# Patient Record
Sex: Female | Born: 1955
Health system: Southern US, Community
[De-identification: ages and names within clinical notes are randomized; demographics above are authoritative.]

## PROBLEM LIST (undated history)

## (undated) DIAGNOSIS — R51 Headache: Secondary | ICD-10-CM

## (undated) DIAGNOSIS — D259 Leiomyoma of uterus, unspecified: Secondary | ICD-10-CM

## (undated) DIAGNOSIS — J309 Allergic rhinitis, unspecified: Secondary | ICD-10-CM

## (undated) DIAGNOSIS — I498 Other specified cardiac arrhythmias: Secondary | ICD-10-CM

## (undated) DIAGNOSIS — Z780 Asymptomatic menopausal state: Secondary | ICD-10-CM

## (undated) DIAGNOSIS — E785 Hyperlipidemia, unspecified: Secondary | ICD-10-CM

## (undated) DIAGNOSIS — F329 Major depressive disorder, single episode, unspecified: Secondary | ICD-10-CM

## (undated) DIAGNOSIS — E042 Nontoxic multinodular goiter: Secondary | ICD-10-CM

## (undated) DIAGNOSIS — D72819 Decreased white blood cell count, unspecified: Secondary | ICD-10-CM

## (undated) HISTORY — DX: Hyperlipidemia, unspecified: E78.5

## (undated) HISTORY — DX: Other specified cardiac arrhythmias: I49.8

## (undated) HISTORY — DX: Headache: R51

## (undated) HISTORY — DX: Allergic rhinitis, unspecified: J30.9

## (undated) HISTORY — DX: Nontoxic multinodular goiter: E04.2

## (undated) HISTORY — DX: Decreased white blood cell count, unspecified: D72.819

## (undated) HISTORY — DX: Leiomyoma of uterus, unspecified: D25.9

## (undated) HISTORY — DX: Asymptomatic menopausal state: Z78.0

## (undated) HISTORY — DX: Major depressive disorder, single episode, unspecified: F32.9

---

## 1997-11-19 ENCOUNTER — Ambulatory Visit (HOSPITAL_COMMUNITY): Admission: RE | Admit: 1997-11-19 | Discharge: 1997-11-19 | Payer: Self-pay | Admitting: Obstetrics and Gynecology

## 1999-06-22 HISTORY — PX: ABDOMINAL HYSTERECTOMY: SHX81

## 1999-11-18 ENCOUNTER — Other Ambulatory Visit: Admission: RE | Admit: 1999-11-18 | Discharge: 1999-11-18 | Payer: Self-pay | Admitting: Obstetrics and Gynecology

## 1999-11-20 ENCOUNTER — Encounter: Payer: Self-pay | Admitting: Obstetrics and Gynecology

## 1999-11-20 ENCOUNTER — Ambulatory Visit (HOSPITAL_COMMUNITY): Admission: RE | Admit: 1999-11-20 | Discharge: 1999-11-20 | Payer: Self-pay | Admitting: Obstetrics and Gynecology

## 2000-04-06 ENCOUNTER — Inpatient Hospital Stay (HOSPITAL_COMMUNITY): Admission: RE | Admit: 2000-04-06 | Discharge: 2000-04-08 | Payer: Self-pay | Admitting: Obstetrics and Gynecology

## 2000-04-06 ENCOUNTER — Encounter (INDEPENDENT_AMBULATORY_CARE_PROVIDER_SITE_OTHER): Payer: Self-pay

## 2000-04-27 ENCOUNTER — Ambulatory Visit: Admission: RE | Admit: 2000-04-27 | Discharge: 2000-04-27 | Payer: Self-pay | Admitting: Gynecology

## 2000-07-05 ENCOUNTER — Observation Stay (HOSPITAL_COMMUNITY): Admission: RE | Admit: 2000-07-05 | Discharge: 2000-07-06 | Payer: Self-pay | Admitting: Obstetrics and Gynecology

## 2000-07-05 ENCOUNTER — Encounter (INDEPENDENT_AMBULATORY_CARE_PROVIDER_SITE_OTHER): Payer: Self-pay

## 2000-07-05 ENCOUNTER — Encounter (INDEPENDENT_AMBULATORY_CARE_PROVIDER_SITE_OTHER): Payer: Self-pay | Admitting: Specialist

## 2000-12-05 ENCOUNTER — Other Ambulatory Visit: Admission: RE | Admit: 2000-12-05 | Discharge: 2000-12-05 | Payer: Self-pay | Admitting: Obstetrics and Gynecology

## 2001-12-20 ENCOUNTER — Other Ambulatory Visit: Admission: RE | Admit: 2001-12-20 | Discharge: 2001-12-20 | Payer: Self-pay | Admitting: Obstetrics and Gynecology

## 2002-04-24 ENCOUNTER — Encounter: Payer: Self-pay | Admitting: Obstetrics and Gynecology

## 2002-04-24 ENCOUNTER — Ambulatory Visit (HOSPITAL_COMMUNITY): Admission: RE | Admit: 2002-04-24 | Discharge: 2002-04-24 | Payer: Self-pay | Admitting: Obstetrics and Gynecology

## 2003-02-13 ENCOUNTER — Ambulatory Visit (HOSPITAL_COMMUNITY): Admission: RE | Admit: 2003-02-13 | Discharge: 2003-02-13 | Payer: Self-pay | Admitting: Endocrinology

## 2003-02-13 ENCOUNTER — Encounter: Payer: Self-pay | Admitting: Endocrinology

## 2003-02-26 ENCOUNTER — Other Ambulatory Visit: Admission: RE | Admit: 2003-02-26 | Discharge: 2003-02-26 | Payer: Self-pay | Admitting: Obstetrics and Gynecology

## 2003-03-04 ENCOUNTER — Encounter: Payer: Self-pay | Admitting: Endocrinology

## 2003-03-04 ENCOUNTER — Encounter (INDEPENDENT_AMBULATORY_CARE_PROVIDER_SITE_OTHER): Payer: Self-pay

## 2003-03-04 ENCOUNTER — Ambulatory Visit (HOSPITAL_COMMUNITY): Admission: RE | Admit: 2003-03-04 | Discharge: 2003-03-04 | Payer: Self-pay | Admitting: Endocrinology

## 2004-05-21 ENCOUNTER — Ambulatory Visit: Payer: Self-pay | Admitting: Endocrinology

## 2004-06-03 ENCOUNTER — Ambulatory Visit (HOSPITAL_COMMUNITY): Admission: RE | Admit: 2004-06-03 | Discharge: 2004-06-03 | Payer: Self-pay | Admitting: Endocrinology

## 2004-09-22 ENCOUNTER — Ambulatory Visit (HOSPITAL_COMMUNITY): Admission: RE | Admit: 2004-09-22 | Discharge: 2004-09-22 | Payer: Self-pay | Admitting: Obstetrics and Gynecology

## 2004-10-27 ENCOUNTER — Ambulatory Visit: Payer: Self-pay | Admitting: Endocrinology

## 2004-12-31 ENCOUNTER — Ambulatory Visit: Payer: Self-pay | Admitting: Endocrinology

## 2006-04-07 ENCOUNTER — Ambulatory Visit: Payer: Self-pay | Admitting: Endocrinology

## 2006-04-07 LAB — CONVERTED CEMR LAB
ALT: 16 units/L (ref 0–40)
Albumin: 4.3 g/dL (ref 3.5–5.2)
Basophils Relative: 0.5 % (ref 0.0–1.0)
Bilirubin Urine: NEGATIVE
Calcium: 9.3 mg/dL (ref 8.4–10.5)
Crystals: NEGATIVE
GFR calc non Af Amer: 80 mL/min
Glomerular Filtration Rate, Af Am: 97 mL/min/{1.73_m2}
HDL: 45.8 mg/dL (ref >39.0)
Hemoglobin: 12.7 g/dL (ref 12.0–15.0)
Lymphocytes Relative: 26.8 % (ref 12.0–46.0)
MCV: 89.5 fL (ref 78.0–100.0)
Platelets: 212 10*3/uL (ref 150–400)
Sodium: 140 meq/L (ref 135–145)
TSH: 1.24 microintl units/mL (ref 0.35–5.50)
Total Bilirubin: 0.3 mg/dL (ref 0.3–1.2)
Total Protein: 7 g/dL (ref 6.0–8.3)
Urine Glucose: NEGATIVE mg/dL
Urobilinogen, UA: 0.2 (ref 0.0–1.0)
pH: 7 (ref 5.0–8.0)

## 2006-04-15 ENCOUNTER — Ambulatory Visit: Payer: Self-pay | Admitting: Endocrinology

## 2006-04-21 ENCOUNTER — Ambulatory Visit (HOSPITAL_COMMUNITY): Admission: RE | Admit: 2006-04-21 | Discharge: 2006-04-21 | Payer: Self-pay | Admitting: Endocrinology

## 2006-04-29 ENCOUNTER — Ambulatory Visit: Payer: Self-pay | Admitting: Endocrinology

## 2006-10-24 ENCOUNTER — Ambulatory Visit (HOSPITAL_COMMUNITY): Admission: RE | Admit: 2006-10-24 | Discharge: 2006-10-24 | Payer: Self-pay | Admitting: Obstetrics and Gynecology

## 2006-11-16 ENCOUNTER — Ambulatory Visit: Payer: Self-pay | Admitting: Internal Medicine

## 2007-02-18 ENCOUNTER — Encounter: Payer: Self-pay | Admitting: Endocrinology

## 2007-02-18 DIAGNOSIS — E785 Hyperlipidemia, unspecified: Secondary | ICD-10-CM | POA: Insufficient documentation

## 2007-02-18 DIAGNOSIS — J309 Allergic rhinitis, unspecified: Secondary | ICD-10-CM | POA: Insufficient documentation

## 2007-02-18 HISTORY — DX: Allergic rhinitis, unspecified: J30.9

## 2007-02-18 HISTORY — DX: Hyperlipidemia, unspecified: E78.5

## 2007-05-01 ENCOUNTER — Encounter: Payer: Self-pay | Admitting: Endocrinology

## 2007-07-25 ENCOUNTER — Ambulatory Visit: Payer: Self-pay | Admitting: Endocrinology

## 2008-02-19 ENCOUNTER — Telehealth (INDEPENDENT_AMBULATORY_CARE_PROVIDER_SITE_OTHER): Payer: Self-pay | Admitting: *Deleted

## 2008-11-07 ENCOUNTER — Ambulatory Visit: Payer: Self-pay | Admitting: Endocrinology

## 2008-11-07 DIAGNOSIS — R51 Headache: Secondary | ICD-10-CM

## 2008-11-07 DIAGNOSIS — F3289 Other specified depressive episodes: Secondary | ICD-10-CM

## 2008-11-07 DIAGNOSIS — I498 Other specified cardiac arrhythmias: Secondary | ICD-10-CM

## 2008-11-07 DIAGNOSIS — E042 Nontoxic multinodular goiter: Secondary | ICD-10-CM

## 2008-11-07 DIAGNOSIS — D72819 Decreased white blood cell count, unspecified: Secondary | ICD-10-CM | POA: Insufficient documentation

## 2008-11-07 DIAGNOSIS — F329 Major depressive disorder, single episode, unspecified: Secondary | ICD-10-CM

## 2008-11-07 DIAGNOSIS — R519 Headache, unspecified: Secondary | ICD-10-CM | POA: Insufficient documentation

## 2008-11-07 DIAGNOSIS — D259 Leiomyoma of uterus, unspecified: Secondary | ICD-10-CM | POA: Insufficient documentation

## 2008-11-07 DIAGNOSIS — Z780 Asymptomatic menopausal state: Secondary | ICD-10-CM

## 2008-11-07 HISTORY — DX: Leiomyoma of uterus, unspecified: D25.9

## 2008-11-07 HISTORY — DX: Headache: R51

## 2008-11-07 HISTORY — DX: Major depressive disorder, single episode, unspecified: F32.9

## 2008-11-07 HISTORY — DX: Other specified cardiac arrhythmias: I49.8

## 2008-11-07 HISTORY — DX: Asymptomatic menopausal state: Z78.0

## 2008-11-07 HISTORY — DX: Other specified depressive episodes: F32.89

## 2008-11-07 HISTORY — DX: Decreased white blood cell count, unspecified: D72.819

## 2008-11-07 HISTORY — DX: Nontoxic multinodular goiter: E04.2

## 2008-11-07 LAB — CONVERTED CEMR LAB
ALT: 12 units/L (ref 0–35)
Alkaline Phosphatase: 65 units/L (ref 39–117)
Basophils Absolute: 0 10*3/uL (ref 0.0–0.1)
Chloride: 104 meq/L (ref 96–112)
Eosinophils Absolute: 0.1 10*3/uL (ref 0.0–0.7)
GFR calc non Af Amer: 110.26 mL/min (ref >60)
Glucose, Bld: 93 mg/dL (ref 70–99)
HCT: 37.3 % (ref 36.0–46.0)
HDL: 49.9 mg/dL (ref >39.00)
Ketones, ur: NEGATIVE mg/dL
MCHC: 33.8 g/dL (ref 30.0–36.0)
MCV: 90.3 fL (ref 78.0–100.0)
Neutrophils Relative %: 53.4 % (ref 43.0–77.0)
RBC: 4.13 M/uL (ref 3.87–5.11)
Specific Gravity, Urine: 1.01 (ref 1.000–1.030)
TSH: 1.45 microintl units/mL (ref 0.35–5.50)
Total CHOL/HDL Ratio: 6
VLDL: 26.2 mg/dL (ref 0.0–40.0)
WBC: 4.7 10*3/uL (ref 4.5–10.5)
pH: 7 (ref 5.0–8.0)

## 2008-11-27 ENCOUNTER — Ambulatory Visit (HOSPITAL_COMMUNITY): Admission: RE | Admit: 2008-11-27 | Discharge: 2008-11-27 | Payer: Self-pay | Admitting: Obstetrics and Gynecology

## 2008-12-03 ENCOUNTER — Telehealth (INDEPENDENT_AMBULATORY_CARE_PROVIDER_SITE_OTHER): Payer: Self-pay | Admitting: *Deleted

## 2009-01-15 ENCOUNTER — Ambulatory Visit: Payer: Self-pay | Admitting: Endocrinology

## 2009-01-15 LAB — CONVERTED CEMR LAB
AST: 29 units/L (ref 0–37)
Alkaline Phosphatase: 60 units/L (ref 39–117)
Bilirubin, Direct: 0.1 mg/dL (ref 0.0–0.3)
Cholesterol: 194 mg/dL (ref 0–200)
HDL: 57.7 mg/dL (ref >39.00)
Total CHOL/HDL Ratio: 3
VLDL: 12 mg/dL (ref 0.0–40.0)

## 2009-08-25 ENCOUNTER — Telehealth: Payer: Self-pay | Admitting: Endocrinology

## 2010-07-12 ENCOUNTER — Encounter: Payer: Self-pay | Admitting: Obstetrics and Gynecology

## 2010-07-23 NOTE — Progress Notes (Signed)
  Phone Note Refill Request Message from:  Fax from Pharmacy on August 25, 2009 2:17 PM  Refills Requested: Medication #1:  ISOMETHEPTENE-APAP-DICHLORAL 65-325-100 MG  CAPS TAKE 1 by mouth Q 2 HOURS PRN Is this the same thing as Epidrin ( Isometheptene-Apap-Dichloral 65-325-100 Mg  Caps)? If so can it be refilled? Please advise  Initial call taken by: Josph Macho RMA,  August 25, 2009 2:17 PM  Follow-up for Phone Call        please refill prn Follow-up by: Minus Breeding MD,  August 25, 2009 2:34 PM  Additional Follow-up for Phone Call Additional follow up Details #1::        Faxed to pharmacy Additional Follow-up by: Josph Macho RMA,  August 25, 2009 3:30 PM    Prescriptions: ISOMETHEPTENE-APAP-DICHLORAL 65-325-100 MG  CAPS (APAP-ISOMETHEPTENE-DICHLORAL) TAKE 1 by mouth Q 2 HOURS PRN  #30 x 1   Entered by:   Josph Macho RMA   Authorized by:   Minus Breeding MD   Signed by:   Josph Macho RMA on 08/25/2009   Method used:   Printed then faxed to ...       [facility]  [address] [phone]* (retail)       [address]       [place], [place]  [id number]       Ph: [phone] or [phone]       Fax: [phone]   RxID:   [id number]

## 2010-11-03 NOTE — Assessment & Plan Note (Signed)
[facility]                           PRIMARY CARE OFFICE NOTE   Sarah Reid, CAPETILLO                        MRN:          [id number]  DATE:11/16/2006                            DOB:          03-05-1956    Sarah Reid is a patient of Dr. Gregary Signs A. Everardo All who is seen acutely today  for the complaint of having a mild paresthesia; loss of sensation in her  right leg, left heel, face and ear, and right buttock. She reports this  symptoms have been present for a week and seem to be getting worse. She  has had no fevers, sweats or chills. She has had no focal weakness. She  denies any injury. She has had no recent injury. She has had no out of  state travel. The patient denies any stumbling or change in her gait.  She denies any scuffing of her shoe toe. She has had no change in  vision. She has had no change in motor strength.   PAST MEDICAL HISTORY:  SURGICAL:  TAH/BSO. MEDICAL:  1. History of uterine fibroid leading to hysterectomy.  2. Hyperlipidemia.  3. Headache.  4. Sinus bradycardia.  5. Multinodular goiter.  6. Allergic rhinitis.   CURRENT MEDICATIONS:  1. Estrogen 1 mg b.i.d.  2. Tums daily.  3. Prozac 20 mg daily.  4. Benadryl p.r.n.  5. Duradrin for headache p.r.n.   REVIEW OF SYSTEMS:  The patient has had no fevers or chills. She has had  no chest pain or chest discomfort. No palpitations. She has had no  respiratory compromise or cough. She has had no GI complaints.   PHYSICAL EXAMINATION:  Temperature was 97.5, blood pressure 94/63, pulse  68, weight 118.  GENERAL APPEARANCE:  This is a slender, well-groomed woman in no acute  distress.  HEENT:  Unremarkable.  NEUROLOGICAL:  The patient is awake, alert, oriented to person, place,  time and context. Cognition is normal. Cranial nerves II-XII are grossly  intact with normal facial asymmetry and movement. Extraocular muscles  were intact. Pupils are equal, round, and reactive. Motor  strength is  5/5 throughout. Cerebellar function is unremarkable with normal gait,  station, ability to get to the exam table without assistance. The  patient was tested and found to be well preserved with light touch,  pinprick and deep vibratory sensation in her lower extremities. She had  normal light touch in the facial distribution.   ASSESSMENT AND PLAN:  Paresthesia. Patient with very mild paresthesia  described as having a dull sensation to her skin. Objective testing is  unrevealing. Her neurological exam is nonfocal.   PLAN:  Discussed with the patient the negative findings. Went over the  realm of possibilities including early signs of demyelinating disease  such as MS. However, the patient really has no sentinel symptoms of MS  such as loss of gait balance, visual changes. At this point, there is no  indication for any neurological imaging. I would recommend watchful  waiting. If her symptoms persist and/or become more dense, would  recommend laboratory evaluation to include sedimentation rate,  TSH, B12,  VDRL. Again, if her symptoms should persist or get worse, would then  consider either neurological imaging or more preferably a neurological  consultation.     Rosalyn Gess Norins, MD  Electronically Signed    MEN/MedQ  DD: 11/17/2006  DT: 11/17/2006  Job #: [id number]   cc:   Mariella Saa

## 2010-11-06 NOTE — Consult Note (Signed)
[facility]  Patient:    Sarah Reid, Sarah Reid                 MRN: [id number] Proc. Date: 04/27/00 Adm. Date:  [id number] Attending:  Jeannette Corpus CC:         Maris Berger. Pennie Rushing, M.D.  Telford Nab, R.N. - GYN Oncology Program   Consultation Report  HISTORY:  This is a 55 year old white female referred by Dr. Dierdre Forth for second opinion regarding management of a newly diagnosed borderline ovarian cancer.  The patient underwent exploratory laparotomy, left salpingo-oophorectomy and anterior Burch cystourethropexy on October 17.  SHe had a cyst in the left ovary measuring approximately 5 cm, which was both a corpus luteum and a small occult borderline serous tumor.  The tumor itself was 1 cm in greatest dimension.  It was cystic, yet had some extremely rare foci of serous borderline tumor on the surface of the ovary.  The patient also had leiomyomata of the uterus.  The right ovary remains in situ.  The patient has had an uncomplicated postoperative course.  She presents today to discuss management and the natural history of borderline tumors.  I had a lengthy discussion with the patient regarding the natural history of borderline tumors.  I indicated to her that these early stage tumors are rate to reoccur, and that observation is the proper mode of management.  I emphasized that there was no adjuvant therapy such as chemotherapy or radiation therapy that would improve her already excellent prognosis. Concerns regarding the remaining ovary were discussed at length.  I would think this could either be managed by serial ultrasounds, observing that ovary, which I think is at low risk for having reoccurrence, or prophylactically removing the ovary, hopefully by laparoscopy.  After a lengthy discussion with the patient regarding the pros and cons of each, the risks and benefits of surgery, and the possibility that laparotomy would  be necessary to remove the ovary, she indicated that she strongly feels that she would like to have the ovary removed at some point in the future.  I think this is fine.  Finally, with regard to long term follow up whether she does or does not have the ovary removed, I would suggest she be examined every six months to be certain there is no enlarging mass.  I do not believe serial CA125 values would be of significant value.  The patient will be returning to see Dr. Pennie Rushing later this month, and we will communicate these recommendations to Dr. Pennie Rushing by way of this summary. DD:  04/27/00 TD:  04/28/00 Job: [id number]

## 2010-11-06 NOTE — Op Note (Signed)
[facility]  Patient:    Sarah Reid, Sarah Reid                 MRN: [id number] Proc. Date: 04/06/00 Adm. Date:  [id number] Attending:  Shaune Spittle CC:         Maris Berger. Pennie Rushing, M.D.   Operative Report  PREOPERATIVE DIAGNOSIS:  Stress incontinence.  POSTOPERATIVE DIAGNOSIS:  Stress incontinence.  PROCEDURE:  Burch anterior urethropexy.  SURGEON:  Excell Seltzer. Annabell Howells, M.D.  ASSISTANT:  Maris Berger. Pennie Rushing, M.D.  ANESTHESIA:  General.  DRAINS:  Foley.  COMPLICATIONS:  None.  INDICATIONS:  The patient is a 55 year old white female with symptomatic uterine fibroids who is to undergo an abdominal hysterectomy.  She also has stress incontinence and is to undergo an anterior urethropexy at the time of her hysterectomy.  FINDINGS AND DESCRIPTION OF PROCEDURE:  Dr. Pennie Rushing had the patient in the operating room under general anesthetic and had completed the total abdominal hysterectomy and left salpingo-oophorectomy through a Pfannenstiel incision. She had closed the peritoneum.  A 16-French Foley catheter was in place and the vaginal area had been prepped.  I then joined the surgery.  A Balfour retractor was placed.  A Senaida Ores was used to provide traction on the lower edge of the wound margin.  The bladder neck was identified after placing two fingers in the vaginal vault to aid localization of the appropriate anatomy. A 0 Vicryl figure-of-eight stitch was placed into the vaginal wall, just lateral to the urethra at the junction of the mid and proximal third to the urethra on each side.  A second row of stitches was placed 2 cm cephalad to this, but more laterally on the vaginal wall to avoid injury to the bladder. Once these two sets of sutures were placed, the sutures were placed to the appropriate location of Coopers ligament.  Each set of sutures was then tied under minimal tension to provide support to the urethrovesical angle.   The vaginal fingers had been removed and the gloves changed prior to tying the sutures.  Once the repair had been completed, the wound was then closed by Dr. Pennie Rushing. There were no complications during my portion of the procedure.  Of note, there was a bit of blood in the catheter after the anterior urethropexy, probably due to superficial bladder wall injury, but I was very careful to keep by stitches lateral on the vaginal wall and lateral to the urethra.  The catheter we left indwelling for two days before voiding trial.  There were no complications throughout the procedure. DD:  04/06/00 TD:  04/07/00 Job: [id number]

## 2010-11-06 NOTE — H&P (Signed)
[facility]  Patient:    Sarah Reid, Sarah Reid                       MRN: [id number] Adm. Date:  03/24/00 Attending:  Erie Noe P. Pennie Rushing, M.D. Dictator:   Henreitta Leber, P.A.                         History and Physical  DATE OF BIRTH:                1956-04-29  HISTORY OF PRESENT ILLNESS:   This is a 55 year old, gravida 1, para 1, white female with a history of menorrhagia, irregular menses, anemia, pelvic pain, dysmenorrhea, and large uterine fibroids, who is presenting for a total abdominal hysterectomy.  In addition since the birth of patients child in 1977 she has experienced stress urinary incontinence and has therefore consented for an anterior urethropexy.  OBSTETRICAL HISTORY:          Gravida 1, para 1.  GYNECOLOGIC HISTORY:          Menorrhagia 55 years old.  The patient uses abstinence as her method of contraception.  Please see patients history of present illness for menstrual history.  The patient had a normal mammogram, June 2001 and a normal Pap smear May 2001.  PAST MEDICAL HISTORY:         Hypercholesterolemia and anemia.  PAST SURGICAL HISTORY:        Status post umbilical hernia repair, 1960, diagnostic laparoscopy and D&C in 1991 for abnormal uterine bleeding and pelvic mass.  The results of these procedures revealed bilateral polycystic ovaries, submucosa myoma, subserosal leiomyoma, and benign proliferative endometrium.  FAMILY HISTORY:               Positive for strokes.  CURRENT MEDICATIONS:          Ibuprofen, glucosamine, red yeast rice, Colace and multivitamins.  ALLERGIES:                    None.  SOCIAL HISTORY:               The patient is divorced.  She does not use tobacco, alcohol or recreational drugs.  REVIEW OF SYSTEMS:            Significant for irregular heavy menstrual periods, pelvic pain, dysmenorrhea, mild anemia, and fatigue.  PHYSICAL EXAMINATION:  GENERAL:                      This is a thin  white female in no acute distress.  VITAL SIGNS:                  Blood pressure is 90/60, weight is 121 pounds, height is 5 feet 4 inches tall.  SKIN:                         Pale and dry.  NECK:                         Supple without masses.  LUNGS:                        Without wheezes, rales or rhonchi.  HEART:  Regular rate and rhythm.  No murmur, rub or gallop.  EXTREMITIES:                  Without gross deformity, patient moves all freely, no cyanosis or edema.  ABDOMEN:                      Bowel sounds are present, it is soft.  The patient has a mass arising from the pelvis to the left midway between the umbilicus and the symphysis pubis which is firm and tender, left edge greater than the right.  The patient does have guarding.  There is no rebound or hepatosplenomegaly.  PELVIC:                       EG/BUS is within normal limits.  Vagina is rugose.  Cervix reveals nabothian cyst, however, it is nontender.  Uterus is 16-week size, tender and irregular with deviation to the left.  Right adnexum is without masses or tenderness.  Left adnexum with a palpable firm mass appearing to arise from the uterus which is tender.  Rectovaginal is without tenderness or masses.  IMPRESSION:                   Large symptomatic uterine fibroids and stress urinary incontinence.  DISPOSITION:                  A long discussion was held with the patient concerning her options for the management of her fibroids which include observation, Lupron Depot, myomectomy and hysterectomy.  The patient states that she wishes to undergo hysterectomy and further discussion was held to review with patient the hysterectomy consent form and reiterate the risk of anesthesia, bleeding, infection, and damage to adjacent organs.  The patient seemed to understand the procedure and had all her questions answered, therefore has consented to undergo a total abdominal hysterectomy.   In addition, patient, following evaluation by urologist, Dr. Marcelyn Bruins, has further consented to undergo an anterior urethropexy.  She verbalized her understanding that the procedure risks may include bleeding, infection, bladder outlet obstruction, persistent incontinence, deep vein thrombosis, pulmonary embolus, and anesthetic complications.  The patient has consented for this procedure as well.  The patient was therefore scheduled to undergo a total abdominal hysterectomy with an anterior urethropexy at [facility] April 06, 2000, at 11:45 a.m. DD:  03/24/00 TD:  03/24/00 Job: [id number]

## 2010-11-06 NOTE — Discharge Summary (Signed)
[facility]  Patient:    Sarah Reid, Sarah Reid                 MRN: [id number] Adm. Date:  [id number] Disc. Date: [id number] Attending:  Shaune Spittle Dictator:   Henreitta Leber, P.A.                           Discharge Summary  DISCHARGE DIAGNOSES:           1. History of borderline left ovarian serous                                   tumor.                                2. Status post total abdominal hysterectomy                                   with a left salpingo-oophorectomy.  OPERATIONS:                   On the day of admission the patient underwent a laparoscopic right salpingo-oophorectomy.  HISTORY OF PRESENT ILLNESS:   [name] is a 55 year old, divorced, white female G1, P1-0-0-1 who underwent a total abdominal hysterectomy with left salpingo-oophorectomy on April 06, 2000.  The pathology from patients left oophorectomy revealed a small occult borderline serous tumor.  The patient presented for a right salpingo-oophorectomy as a prophylactic measure for the prevention of tumor recurrence.  Please see patients dictated history and physical examination for details.  PHYSICAL EXAMINATION:  VITAL SIGNS:                  Blood pressure 110/68, weight is 113.5.  GENERAL:                      Within normal limits.  PELVIC:                       EGD left within normal limits.  Vagina is rugae and is well suspended.  At the left edge of the patients vaginal cuff was a small area of viable granuloma which was cauterized using a silver nitrate stick.  The uterus and cervix were surgically absent.  The adnexa without masses or tenderness.  RECTOVAGINAL:                 Without masses or tenderness.  HOSPITAL COURSE:              On the date of admission, the patient underwent laparoscopic removal of her right tube and ovary tolerating the procedure well.  Later on date of surgery, patient experienced significant nausea causing  her to remain overnight in the hospital for observation, however, she quickly tolerated a regular diet on the morning of postop day one and was deemed ready for discharge to home.  DISCHARGE MEDICATIONS:        1. Vicodin 1-2 tablets q.4-6h. for pain.                               2. Ibuprofen 600 mg 1 tablet q.6h. with  food for pain.                               3. Phenergan 25 mg 1 tablet q.6h. p.r.n.                                  for nausea.  FOLLOWUP:                     Patient is scheduled for postoperative exam with Dr. Dierdre Forth July 11, 2000 at 2:15 p.m.  DISCHARGE INSTRUCTIONS:       The patient was given a copy of [facility] home care instructions for laparoscopy.  Final pathology was not available at the time of patients discharge. DD:  07/06/00 TD:  07/06/00 Job: [id number]

## 2010-11-06 NOTE — Op Note (Signed)
[facility]  Patient:    Sarah Reid, Sarah Reid                 MRN: [id number] Proc. Date: 04/06/00 Adm. Date:  [id number] Attending:  Shaune Spittle CC:         Excell Seltzer. Annabell Howells, M.D.   Operative Report  PREOPERATIVE DIAGNOSES:       1. Symptomatic uterine fibroids.                               2. Menorrhagia.                               3. Stress urinary incontinence.  POSTOPERATIVE DIAGNOSES:      1. Symptomatic uterine fibroids.                               2. Menorrhagia.                               3. Stress urinary incontinence.                               4. Left ovarian cyst.  OPERATION:                    Total abdominal hysterectomy, left                               salpingo-oophorectomy, and anterior urethropexy.  SURGEON:                      Vanessa P. Pennie Rushing, M.D.  ASSISTANT:                    Henreitta Leber, P.A.  CONSULTANT AND SURGEON FOR ANTERIOR URETHROPEXY:     Excell Seltzer. Annabell Howells, M.D.  ANESTHESIA:                   General orotracheal.  ESTIMATED BLOOD LOSS:         200 cc.  COMPLICATIONS:                None.  FINDINGS:                     The uterus was enlarged to approximately 16 weeks size with multiple uterine fibroids.  There was a left ovarian cyst, measuring approximately 5 cm with excresences and a question of endometriosis. The right ovary was normal size with a question of small endometriotic implants.  There was also a right posterior uterosacral peritoneal window.  DESCRIPTION OF PROCEDURE:     The patient was taken to the operating room and placed on the operating table.  After the obtainment of adequate general anesthesia, she was placed in the frog-leg position for the abdomen, perineum, and vagina to be prepped.  She was then placed in the supine position and the abdomen draped as a sterile field.  A transverse incision was made in the abdomen and the abdomen opened in layers.  The  peritoneum was entered and initially peritoneal washings were obtained, but subsequently discarded.  The  upper abdomen was evaluated and found to be negative.  The pelvis contained the above noted findings.  A self-retaining OConnor-OSullivan retractor was placed and the bowel packed cephalad.  ______ clamps were placed on the cornual regions of the uterus and the left round ligament suture ligated and then incised, and the peritoneum incised along the length of the infundibulopelvic ligament.  The utero-ovarian ligament was clamped, cut, and suture ligated, and the left ureter identified.  The left infundibulopelvic ligament was then clamped, cut, and tied with a free tie and suture ligated, and the left ovary removed and sent for frozen section.  The right round ligament was then suture ligated and incised, and that incision taken anteriorly on the anterior leaf of the broad ligament.  A similar procedure was carried out on the opposite side, and the bladder bluntly dissection off the anterior cervix.  The utero-ovarian ligament on the right side was then clamped, cut, and suture ligated.  The uterine vessels were then skeletonized and the right and left uterine vessels clamped, cut, and suture ligated.  The parametrial and paracervical tissues were then clamped, cut, and suture ligated on the right and left sides.  The uterosacral ligaments on the right and left sides were clamped, cut, and suture ligated, and those sutures held. The vaginal angles were then clamped, cut, and the remaining uterus and cervix removed from the operative field.  The uterine fundus had been amputated prior to removal of the cervix and after clamping and cutting of the uterine vessels.                                At this time, the entire specimen had been removed and the vaginal cuff was closed with figure-of-eight sutures of 0 Vicryl.  The vaginal angles were suture ligated with Richardson sutures of 0  Vicryl and those sutures held.  Copious irrigation was carried out and hemostasis noted to be adequate.  The vaginal angle sutures and uterosacral ligament sutures were then tied together on either side and the angled sutures tied to the round ligaments on either side.  Copious irrigation was again carried out and hemostasis noted to be adequate.  All instruments were then removed from the peritoneal cavity and the peritoneum closed with a running suture of 2-0 Vicryl.  The rectus muscles were noted to be hemostatic and irrigated.                                At that time, Dr. Annabell Howells came in for completion of the anterior urethropexy and after completion of that procedure which is dictated under a separate operative report, the fascia was closed with a running suture of 0 Vicryl and then reinforced on either side of the midline with figure-of-eight sutures of 0 Vicryl.  The subcutaneous tissue was irrigated and noted to be hemostatic.  The skin incision was closed with skin staples.  A sterile dressing was applied and the patient awakened from general anesthesia and taken to the recovery room in satisfactory condition having tolerated the procedure well with sponge and instrument counts correct.  A Foley catheter had been inserted at the beginning of the procedure, just after prepping the vagina, and remained connected to straight drainage, and in place during the entire surgical procedure, and through the patients immediate postoperative course.  SPECIMENS TO PATHOLOGY:  Uterus, cervix, and left ovary.  Frozen section diagnosis on the left ovary was large corpus luteum cyst.  It should also be noted that the apparent endometrial implants on the right ovary were cautery ablated. DD:  04/07/00 TD:  04/07/00 Job: [id number]

## 2010-11-06 NOTE — H&P (Signed)
[facility]  Patient:    Sarah Reid, Sarah Reid                 MRN: [id number] Adm. Date:  [id number] Disc. Date: [id number] Attending:  Jeannette Corpus Dictator:   Henreitta Leber, P.A.                         History and Physical  HISTORY OF PRESENT ILLNESS:   This is a 55 year old divorced white female gravida 1, para 1, 0, 0, 1, who underwent a total abdominal hysterectomy with left salpingo-oophorectomy and Burch cystourethropexy (on April 06, 2000), whose final pathology revealed, in addition to a leiomyomata uteri, a 5.0 cm left ovary containing a corpus luteum, and a small occult borderline serous tumor.  The tumor itself measured 1.0 cm at its greatest dimension.  The patient had a GYN oncology consultation with Dr. Reuel Boom L. Clarke-Pearson on April 27, 2000, at which time the patient was advised that such early stage tumors rarely recur, and that observation of her remaining right ovary would be appropriate.  Management options regarding the patients right ovary included serial ultrasounds to observe that ovary, or prophylactic removal of the same by laparoscopy or potentially a laparotomy.  After much consideration, the patient has consented to have a laparoscopic removal of her right ovary with the understanding that a laparotomy may be necessary.  PAST MEDICAL HISTORY:         Positive for hypercholesterolemia and anemia.  OB HISTORY:                   Gravida 1, para 1, 0, 0, 1.  GYN HISTORY:                  Menarche at age 59.  The patient uses abstinence as a method of contraception.  She had a normal mammogram in June 2001, and a normal Pap smear in May 2002.  PAST SURGICAL HISTORY:        The patient is S/P TAH/LSO/Burch cystourethropexy (in October 2001), repair of an umblical hernia in 1960, diagnostic laparoscopy and D&C in 1991, for abnormal uterine bleeding, and pelvic mass.  Please see the patients history of  present illness for significant pathologic findings, as a result of her surgical procedures.  FAMILY HISTORY:               Positive for strokes.  CURRENT MEDICATIONS:          1. Glucosamine.                               2. Colace.                               3. Multivitamins.  ALLERGIES:                    No known drug allergies.  SOCIAL HISTORY:               The patient is divorced.  She does not use tobacco, alcohol, or recreational drugs.  REVIEW OF SYSTEMS:            Negative.  PHYSICAL EXAMINATION:  GENERAL:                      This is a  well-developed, well-nourished thin white female, in no acute distress.  VITAL SIGNS:                  Blood pressure 110/68, weight 113-1/2 pounds.  SKIN:                         Within normal limits.  NECK:                         Supple without masses.  LUNGS:                        Without wheezes, rales, or rhonchi.  HEART:                        A regular rate and rhythm, no murmur, rub, or gallop.  EXTREMITIES:                  Without gross deformity, cyanosis, or edema.  NEUROLOGIC:                   Is within normal limits as tested.  ABDOMEN:                      Bowel sounds present, soft, nontender.  There is no organomegaly.  BACK:                         Without CVA tenderness.  PELVIC:                       EG, BUS within normal limits.  Vagina is rugous and is well-suspended.  At the left edge of the vaginal cuff there is a friable granuloma which was cauterized using a silver nitrate stick.  The uterus and cervix are surgically absent.  The adnexa is without masses or tenderness.  RECTOVAGINAL:                 Examination without masses or tenderness.  IMPRESSION:                   1. History of borderline left ovarian serous                                  tumor.                               2. Status post total abdominal hysterectomy,                                  with a left  salpingo-oophorectomy.  DISPOSITION:                  The patient has reviewed the natural history of a borderline serous ovarian tumor with GYN oncologist, Dr. Stanford Breed. She understands that recurrence of such tumors are rare.  She is also aware that observation of her right ovary by serial ultrasounds is an appropriate management option for her; however, she wishes to proceed with a right salpingo-oophorectomy.  She verbalized understanding of her risks of surgery to include, but limited to, bleeding, infection, reaction to anesthesia, and damage  to adjacent organs.  The patient has consented to undergo a laparoscopic removal of her right ovary and tube on July 05, 2000, at [facility] at 9:45 a.m. DD:  06/29/00 TD:  06/29/00 Job: [id number]

## 2010-11-06 NOTE — Op Note (Signed)
[facility]  Patient:    Sarah Reid, Sarah Reid                          MRN: [id number] Proc. Date: 07/05/00 Attending:  Erie Noe P. Pennie Rushing, M.D. CC:         Rande Brunt. Clarke-Pearson, M.D.   Operative Report  PREOPERATIVE DIAGNOSIS:       Status post total abdominal hysterectomy and left salpingo-oophorectomy with a diagnosis of ovarian lesion of borderline malignant potential.  POSTOPERATIVE DIAGNOSIS:      Status post total abdominal hysterectomy and left salpingo-oophorectomy with a diagnosis of ovarian lesion of borderline malignant potential.  OPERATIONS:                   1. Operative laparoscopy.                               2. Right salpingo-oophorectomy.  SURGEON:                      Vanessa P. Pennie Rushing, M.D.  FIRST ASSISTANT:              Henreitta Leber, P.A.  ANESTHESIA:                   General orotracheal.  ESTIMATED BLOOD LOSS:         Less than 25 cc.  COMPLICATIONS:                None.  FINDINGS:                     The right ovary appeared within normal limits with several simple cysts.  The right tube appeared within normal limits.  The patient is status post total abdominal hysterectomy and left salpingo-oophorectomy.  There were no excrescences on the ovary and no apparent peritoneal lesions.  DESCRIPTION OF PROCEDURE:     The patient was taken to the operating room after appropriate identification and placed on the operating table.  After obtaining adequate general anesthesia, she was placed in the modified lithotomy position.  The abdomen, perineum and vagina were prepped with multiple layers of Betadine and a Foley catheter inserted into the bladder and connected to straight drainage.  The abdomen was draped as a sterile field. Marcaine 0.25% was used to infiltrate the subumbilical and suprapubic regions.  A subumbilical incision was made and a Veress cannula place through that incision into the peritoneal cavity.   Pneumoperitoneum was created with  3.5 L of CO2.  The Veress cannula was removed and a laparoscopic trocar placed through that incision into the peritoneal cavity.  suprapubic incisions were made to the right and left of midline and laparoscopic trocars placed through those incisions into the peritoneal cavity under direct visualization.  The right tube and ovary were identified.  Peritoneal washings were obtained via a Nezhat suction irrigator.  The right ovary was then dissected off the pelvic sidewall and the vaginal cuff with the use of a harmonic scalpel.  The right ureter was identified and the right infundibulopelvic ligament identified. Three Endoloop sutures of 0 Vicryl were used to tie off the infundibulopelvic ligament and the right ovary and tube were excised.  Hemostasis was noted to be adequate.  An Endobag was used to remove the tube and ovary via the umbilical incision with the air of a  5 mm scope through the left suprapubic trocar sleeve.  Some extension of the fascial incision in the subumbilical region was required to retrieve the Endobag containing the tube and ovary intact.  The 10 mm scope was then placed through the subumbilical incision and the pelvis again inspected and hemostasis noted to be adequate.  Approximately 100 cc of warm lactated Ringers was left in the peritoneal cavity.  All instruments were removed from the peritoneal cavity under direct visualization as the CO2 was allowed to escape.  Fascial sutures of 0 Vicryl were placed in the subumbilical incision and a subcutaneous suture placed.  These were all 0 Vicryl.  Dermabond was used to close the subumbilical and suprapubic skin incisions.  The patient was then awakened from general anesthesia and taken to the recovery room in satisfactory condition, having tolerated the procedure well, with sponge and instrument counts correct.  The Foley catheter had been removed prior to leaving the operating  room.  SPECIMENS TO PATHOLOGY:       1. Peritoneal washings.                               2. Right tube and ovary. DD:  07/05/00 TD:  07/05/00 Job: [id number]

## 2010-11-06 NOTE — Discharge Summary (Signed)
[facility]  Patient:    Sarah Reid, Sarah Reid                 MRN: [id number] Adm. Date:  [id number] Disc. Date: [id number] Attending:  Shaune Spittle Dictator:   Henreitta Leber, P.A.                           Discharge Summary  DISCHARGE DIAGNOSES: 1. Symptomatic uterine fibroids. 2. Menorrhagia. 3. Left ovarian cyst. 4. Stress urinary incontinence. 5. Anemia.  PROCEDURES:  On the date of admission, the patient underwent a total abdominal hysterectomy with a left salpingo-oophorectomy and an anterior Burch urethropexy.  HISTORY OF PRESENT ILLNESS:  This is a 55 year old gravida 1, para 1 white female with a history of menorrhagia, irregular periods, anemia, pelvic pain, dysmenorrhea, and large uterine fibroids, who presented for total abdominal hysterectomy.  The patient also has been experiencing stress urinary incontinence since the birth of her child and, therefore, has consented for an anterior urethropexy.  Please see the patients dictated history for details.  PHYSICAL EXAMINATION:  VITAL SIGNS:  Blood pressure is 90/60.  Weight is 121 pounds, height is 5 feet 4 inches tall.  Physical exam is within normal limits.  Do note that the patients skin is pale and dry.  Also, her abdominal exam revealed a mass arising from the pelvis to the left, midway between the umbilicus and the symphysis pubis which was tender and firm.  On pelvic exam, EGBUS was within normal limits.  Vagina was rugose.  Cervix revealed a nabothian cyst; however, it is nontender. Uterus is 16 weeks size, tender, and irregular, with deviation to the left. Right adnexum is without masses or tenderness.  Left adnexum with a palpable firm mass appearing to arise from the uterus, which is nontender. Rectovaginal is without tenderness or masses.  Please see the patients dictated physical exam for details.  HOSPITAL COURSE:  On the date of admission, the patient  underwent a total abdominal hysterectomy with a left salpingo-oophorectomy, tolerating the procedure well.  She further experienced a Burch anterior urethropexy per Dr. Excell Seltzer. Wrenn, tolerating that procedure as well.  The patients postoperative course was remarkable only for delayed resumption of bowel function.  However, by postoperative day #2, she was deemed to have received maximum benefit of her hospital stay and was discharged to home. Postoperative hemoglobin was 8.3 (preoperative hemoglobin 11.8).  DISCHARGE MEDICATIONS: 1. Percocet 1-2 tablets every four to six hours as needed for pain. 2. Feosol 1 tablet twice daily. 3. Ibuprofen 600 mg 1 tablet every six hours with food. 4. Reglan 10 mg 1 tablet every six hours as needed for bloating. 5. Stool softener 60 mg 1 tablet two to three times a day until bowel function    is regular.  FOLLOW-UP:  The patient is to present to [facility] on April 12, 2000, at 11 a.m. for staple removal.  She is also to follow up with Dr. Pennie Rushing on May 16, 2000, at 3:30 p.m. for postoperative follow-up.  She is to call Dr. Aaron Edelman office in two weeks for evaluation of her urethropexy.  DISCHARGE INSTRUCTIONS:  The patient was given a copy of [facility] postoperative instruction sheet.  She was further advised to avoid driving for two weeks, heavy lifting for four weeks, and intercourse for six weeks.  PATHOLOGY:  The patients final pathology was not available at the time  of discharge. DD:  04/08/00 TD:  04/10/00 Job: [id number]

## 2011-03-11 ENCOUNTER — Encounter: Payer: Self-pay | Admitting: Endocrinology

## 2011-03-11 ENCOUNTER — Other Ambulatory Visit (INDEPENDENT_AMBULATORY_CARE_PROVIDER_SITE_OTHER): Payer: PRIVATE HEALTH INSURANCE

## 2011-03-11 ENCOUNTER — Ambulatory Visit (INDEPENDENT_AMBULATORY_CARE_PROVIDER_SITE_OTHER): Payer: PRIVATE HEALTH INSURANCE | Admitting: Endocrinology

## 2011-03-11 DIAGNOSIS — R51 Headache: Secondary | ICD-10-CM

## 2011-03-11 DIAGNOSIS — E042 Nontoxic multinodular goiter: Secondary | ICD-10-CM

## 2011-03-11 DIAGNOSIS — Z0000 Encounter for general adult medical examination without abnormal findings: Secondary | ICD-10-CM | POA: Insufficient documentation

## 2011-03-11 DIAGNOSIS — Z79899 Other long term (current) drug therapy: Secondary | ICD-10-CM

## 2011-03-11 LAB — SEDIMENTATION RATE: Sed Rate: 17 mm/hr (ref 0–22)

## 2011-03-11 LAB — HEPATIC FUNCTION PANEL
Albumin: 4.5 g/dL (ref 3.5–5.2)
Alkaline Phosphatase: 81 U/L (ref 39–117)
Total Protein: 7.4 g/dL (ref 6.0–8.3)

## 2011-03-11 LAB — TSH: TSH: 1.27 u[IU]/mL (ref 0.35–5.50)

## 2011-03-11 MED ORDER — METHOCARBAMOL 500 MG PO TABS: 500.0000 mg | ORAL_TABLET | Freq: Every day | ORAL | Status: DC | PRN

## 2011-03-11 MED ORDER — FLUOXETINE HCL 20 MG PO CAPS: 20.0000 mg | ORAL_CAPSULE | Freq: Every day | ORAL | Status: DC

## 2011-03-11 MED ORDER — ISOMETHEPTENE-APAP-DICHLORAL 65-325-100 MG PO CAPS: 1.0000 | ORAL_CAPSULE | Freq: Four times a day (QID) | ORAL | Status: DC | PRN

## 2011-03-11 MED ORDER — TERBINAFINE HCL 250 MG PO TABS: 250.0000 mg | ORAL_TABLET | Freq: Every day | ORAL | Status: DC

## 2011-03-11 NOTE — Progress Notes (Signed)
Subjective:    Patient ID: Sarah Reid, female    DOB: 08/20/1955, 55 y.o.   MRN: [id number]  HPI Pt states 20 years of intermittent severe headache.  She sometimes has assoc n/v/d.  She says sxs are precip by anxiety, and may last as much as 3 days.  She gets headache 1-2/month.   Past Medical History  Diagnosis Date  . FIBROIDS, UTERUS 11/07/2008  . GOITER, MULTINODULAR 11/07/2008  . HYPERLIPIDEMIA 02/18/2007  . LEUKOPENIA, CHRONIC 11/07/2008  . DEPRESSION 11/07/2008  . BRADYCARDIA 11/07/2008  . ALLERGIC RHINITIS 02/18/2007  . Headache 11/07/2008  . ASYMPTOMATIC POSTMENOPAUSAL STATUS 11/07/2008   Past Surgical History  Procedure Date  . Abdominal hysterectomy 2001   History   Social History  . Marital Status: Single    Spouse Name: N/A    Number of Children: N/A  . Years of Education: N/A   Occupational History  . Unemployed    Social History Main Topics  . Smoking status: Never Smoker   . Smokeless tobacco: Not on file  . Alcohol Use: Not on file  . Drug Use: Not on file  . Sexually Active: Not on file   Other Topics Concern  . Not on file   Social History Narrative  . No narrative on file    Current Outpatient Prescriptions on File Prior to Visit  Medication Sig Dispense Refill  . calcium carbonate (TUMS - DOSED IN MG ELEMENTAL CALCIUM) 500 MG chewable tablet Chew 1 tablet by mouth daily.        Marland Kitchen FLUoxetine (PROZAC) 20 MG capsule Take 20 mg by mouth daily.        . Ibuprofen ([employer] IBUPROFEN) 200 MG CAPS Take 1 capsule by mouth as needed.        . isometheptene-acetaminophen-dichloralphenazone (MIDRIN) 65-325-100 MG capsule Take 1 capsule by mouth every 2 (two) hours as needed.        . methocarbamol (ROBAXIN) 500 MG tablet Take 500 mg by mouth daily as needed. As needed for headache       . NON FORMULARY Estrogen 1mg   1 by mouth two times a day       . simvastatin (ZOCOR) 80 MG tablet Take 80 mg by mouth at bedtime.          No Known Allergies  Family History   Problem Relation Age of Onset  . Cancer Neg Hx   . Thyroid disease Neg Hx     BP 100/68  Pulse 67  Temp(Src) 98.3 F (36.8 C) (Oral)  Ht 5\' 3"  (1.6 m)  Wt 124 lb 6.4 oz (56.427 kg)  BMI 22.04 kg/m2  SpO2 97%    Review of Systems Denies visual loss and loc    Objective:   Physical Exam VS: see vs page GEN: no distress HEAD: head: no deformity eyes: no periorbital swelling, no proptosis external nose and ears are normal mouth: no lesion seen NECK: the thyroid is 3x normal size, with multinodular surface MUSCULOSKELETAL: muscle bulk and strength are grossly normal.  no obvious joint swelling.  gait is normal and steady NEURO:  cn 2-12 grossly intact.   readily moves all 4's.  sensation is intact to touch on the feet NODES:  None palpable at the neck PSYCH: alert, oriented x3.  Does not appear anxious nor depressed. Ext: there is bilat onycholysis of the fingernails.   Lab Results  Component Value Date   WBC 4.7 11/07/2008   HGB 12.6 11/07/2008   HCT  37.3 11/07/2008   PLT 177.0 11/07/2008   CHOL 194 01/15/2009   TRIG 60.0 01/15/2009   HDL 57.70 01/15/2009   LDLDIRECT 273.7 11/07/2008   ALT 12 03/11/2011   AST 19 03/11/2011   NA 146* 11/07/2008   K 4.5 11/07/2008   CL 104 11/07/2008   CREATININE 0.6 11/07/2008   BUN 12 11/07/2008   CO2 30 11/07/2008   TSH 1.27 03/11/2011      Assessment & Plan:  Multinodular goiter, euthyroid Headache, well-controlled with midrin Wellness is addressed today

## 2011-03-11 NOTE — Patient Instructions (Addendum)
Please call if you decide to do the mammogram and colonoscopy tests.  It reduces your chances of dying of cancer. blood tests are being requested for you today.  please call [phone] to hear your test results.  You will be prompted to enter the 9-digit "MRN" number that appears at the top left of this page, followed by #.  Then you will hear the message. Please call if you decide to see the specialist for the headache, or to do other blood tests. i have sent a prescription to your pharmacy, for your medications. Please return in 1 year. (update: i left message on phone-tree:  rx as we discussed).

## 2012-01-19 ENCOUNTER — Encounter: Payer: Self-pay | Admitting: Endocrinology

## 2012-01-19 ENCOUNTER — Ambulatory Visit (INDEPENDENT_AMBULATORY_CARE_PROVIDER_SITE_OTHER): Payer: PRIVATE HEALTH INSURANCE | Admitting: Endocrinology

## 2012-01-19 VITALS — BP 104/68 | HR 62 | Temp 98.1°F | Ht 63.0 in | Wt 126.0 lb

## 2012-01-19 DIAGNOSIS — Z79899 Other long term (current) drug therapy: Secondary | ICD-10-CM

## 2012-01-19 DIAGNOSIS — E042 Nontoxic multinodular goiter: Secondary | ICD-10-CM

## 2012-01-19 MED ORDER — TERBINAFINE HCL 250 MG PO TABS: 250.0000 mg | ORAL_TABLET | Freq: Every day | ORAL | Status: AC

## 2012-01-19 MED ORDER — ISOMETHEPTENE-APAP-DICHLORAL 65-325-100 MG PO CAPS: 1.0000 | ORAL_CAPSULE | Freq: Four times a day (QID) | ORAL | Status: DC | PRN

## 2012-01-19 MED ORDER — CICLOPIROX 8 % EX SOLN
Freq: Every day | CUTANEOUS | Status: DC
Start: 2012-01-19 — End: 2013-04-19

## 2012-01-19 NOTE — Patient Instructions (Addendum)
i have sent 2 prescriptions to your pharmacy, for your thumbnails. blood tests are being requested for you today.  You will receive a letter with results. Here is a refill of the midrin.   You should have the thyroid ultrasound repeated.  Please call if you want to have it done.

## 2012-01-19 NOTE — Progress Notes (Signed)
  Subjective:    Patient ID: Sarah Reid, female    DOB: [birth year], 56 y.o.   MRN: [id number]  HPI Pt states few years of discoloration of the thumbnails, and slight assoc pain. Past Medical History  Diagnosis Date  . FIBROIDS, UTERUS 11/07/2008  . GOITER, MULTINODULAR 11/07/2008  . HYPERLIPIDEMIA 02/18/2007  . LEUKOPENIA, CHRONIC 11/07/2008  . DEPRESSION 11/07/2008  . BRADYCARDIA 11/07/2008  . ALLERGIC RHINITIS 02/18/2007  . Headache 11/07/2008  . ASYMPTOMATIC POSTMENOPAUSAL STATUS 11/07/2008    Past Surgical History  Procedure Date  . Abdominal hysterectomy 2001    History   Social History  . Marital Status: Single    Spouse Name: N/A    Number of Children: N/A  . Years of Education: N/A   Occupational History  . Unemployed    Social History Main Topics  . Smoking status: Never Smoker   . Smokeless tobacco: Not on file  . Alcohol Use: Not on file  . Drug Use: Not on file  . Sexually Active: Not on file   Other Topics Concern  . Not on file   Social History Narrative  . No narrative on file    Current Outpatient Prescriptions on File Prior to Visit  Medication Sig Dispense Refill  . calcium carbonate (TUMS - DOSED IN MG ELEMENTAL CALCIUM) 500 MG chewable tablet Chew 1 tablet by mouth daily.        Marland Kitchen FLUoxetine (PROZAC) 20 MG capsule Take 1 capsule (20 mg total) by mouth daily.  30 capsule  11  . Ibuprofen ([employer] IBUPROFEN) 200 MG CAPS Take 1 capsule by mouth as needed.        . isometheptene-acetaminophen-dichloralphenazone (MIDRIN) 65-325-100 MG capsule Take 1 capsule by mouth 4 (four) times daily as needed.  30 capsule  5  . methocarbamol (ROBAXIN) 500 MG tablet Take 1 tablet (500 mg total) by mouth daily as needed. As needed for headache  50 tablet  11  . NON FORMULARY Estrogen 1mg   1 by mouth two times a day        No Known Allergies  Family History  Problem Relation Age of Onset  . Cancer Neg Hx   . Thyroid disease Neg Hx    BP 104/68  Pulse 62  Temp  98.1 F (36.7 C) (Oral)  Ht 5\' 3"  (1.6 m)  Wt 126 lb (57.153 kg)  BMI 22.32 kg/m2  SpO2 98%  Review of Systems Pt says she does not notice the goiter.  Headache is still a few per week, but she wants to continue midrin     Objective:   Physical Exam VITAL SIGNS:  See vs page GENERAL: no distress NECK: There is no palpable thyroid enlargement.  No thyroid nodule is palpable.  No palpable lymphadenopathy at the anterior neck. Ext: bilateral onychomycosis of the thumbnails       Assessment & Plan:  Onychomycosis, recurrent Multinodular goiter, which is usually hereditary. Chronic headache, not well-controlled

## 2012-03-31 ENCOUNTER — Other Ambulatory Visit: Payer: Self-pay | Admitting: Endocrinology

## 2013-04-19 ENCOUNTER — Ambulatory Visit (INDEPENDENT_AMBULATORY_CARE_PROVIDER_SITE_OTHER): Payer: PRIVATE HEALTH INSURANCE | Admitting: Endocrinology

## 2013-04-19 ENCOUNTER — Encounter: Payer: Self-pay | Admitting: Endocrinology

## 2013-04-19 VITALS — BP 102/68 | HR 63 | Temp 98.0°F | Resp 10 | Wt 127.4 lb

## 2013-04-19 DIAGNOSIS — Z23 Encounter for immunization: Secondary | ICD-10-CM

## 2013-04-19 DIAGNOSIS — H9319 Tinnitus, unspecified ear: Secondary | ICD-10-CM | POA: Insufficient documentation

## 2013-04-19 DIAGNOSIS — H9311 Tinnitus, right ear: Secondary | ICD-10-CM

## 2013-04-19 MED ORDER — FLUOXETINE HCL 20 MG PO CAPS: 20.0000 mg | ORAL_CAPSULE | Freq: Every day | ORAL | Status: DC

## 2013-04-19 MED ORDER — ISOMETHEPTENE-APAP-DICHLORAL 65-325-100 MG PO CAPS: 1.0000 | ORAL_CAPSULE | Freq: Four times a day (QID) | ORAL | Status: DC | PRN

## 2013-04-19 MED ORDER — CICLOPIROX 8 % EX SOLN: Freq: Every day | CUTANEOUS | Status: AC

## 2013-04-19 MED ORDER — HYDROCODONE-ACETAMINOPHEN 10-325 MG PO TABS: 1.0000 | ORAL_TABLET | Freq: Four times a day (QID) | ORAL | Status: DC | PRN

## 2013-04-19 MED ORDER — FLUTICASONE PROPIONATE 50 MCG/ACT NA SUSP: 2.0000 | Freq: Every day | NASAL | Status: AC

## 2013-04-19 NOTE — Patient Instructions (Signed)
i have sent a prescription to your pharmacy, for a steroid nasal spray. Loratadine-d (non-prescription) will help your congestion. I hope you feel better soon.  If you don't feel better in a week or 2, please call back.  If this does not help, you should see an ear-nore-throat specialist.

## 2013-04-19 NOTE — Progress Notes (Signed)
  Subjective:    Patient ID: Sarah Reid, female    DOB: 03/28/1956, 57 y.o.   MRN: [id number]  HPI Pt states 1 year of moderate ringing in the right ear, and assoc decreased hearing.   prozac works well.   She declines CPX, due to high-deductible insurance.  Past Medical History  Diagnosis Date  . FIBROIDS, UTERUS 11/07/2008  . GOITER, MULTINODULAR 11/07/2008  . HYPERLIPIDEMIA 02/18/2007  . LEUKOPENIA, CHRONIC 11/07/2008  . DEPRESSION 11/07/2008  . BRADYCARDIA 11/07/2008  . ALLERGIC RHINITIS 02/18/2007  . Headache(784.0) 11/07/2008  . ASYMPTOMATIC POSTMENOPAUSAL STATUS 11/07/2008    Past Surgical History  Procedure Laterality Date  . Abdominal hysterectomy  2001    History   Social History  . Marital Status: Single    Spouse Name: N/A    Number of Children: N/A  . Years of Education: N/A   Occupational History  . Unemployed    Social History Main Topics  . Smoking status: Never Smoker   . Smokeless tobacco: Not on file  . Alcohol Use: Not on file  . Drug Use: Not on file  . Sexual Activity: Not on file   Other Topics Concern  . Not on file   Social History Narrative  . No narrative on file    Current Outpatient Prescriptions on File Prior to Visit  Medication Sig Dispense Refill  . Ibuprofen ([employer] IBUPROFEN) 200 MG CAPS Take 1 capsule by mouth as needed.        . calcium carbonate (TUMS - DOSED IN MG ELEMENTAL CALCIUM) 500 MG chewable tablet Chew 1 tablet by mouth daily.        . methocarbamol (ROBAXIN) 500 MG tablet TAKE 1 TABLET (500 MG TOTAL) BY MOUTH DAILY AS NEEDED FOR HEADACHE  50 tablet  11  . NON FORMULARY Estrogen 1mg   1 by mouth two times a day        No current facility-administered medications on file prior to visit.    No Known Allergies  Family History  Problem Relation Age of Onset  . Cancer Neg Hx   . Thyroid disease Neg Hx     BP 102/68  Pulse 63  Temp(Src) 98 F (36.7 C) (Oral)  Resp 10  Wt 127 lb 6.4 oz (57.788 kg)  BMI 22.57 kg/m2   SpO2 98%  Review of Systems Denies otalgia.  midrin works well most of the time, but she needs stronger rx for when headache is severe.    Objective:   Physical Exam VITAL SIGNS:  See vs page GENERAL: no distress Both eac's and tm's are normal     Assessment & Plan:  Ear sxs, new, uncertain etiology Migraine, mostly well-controlled Depression: well-controlled

## 2013-10-22 ENCOUNTER — Other Ambulatory Visit: Payer: Self-pay

## 2013-10-22 MED ORDER — ISOMETHEPTENE-APAP-DICHLORAL 65-325-100 MG PO CAPS: 1.0000 | ORAL_CAPSULE | Freq: Four times a day (QID) | ORAL | Status: DC | PRN

## 2014-10-15 ENCOUNTER — Encounter: Payer: Self-pay | Admitting: Endocrinology

## 2014-10-15 ENCOUNTER — Ambulatory Visit (INDEPENDENT_AMBULATORY_CARE_PROVIDER_SITE_OTHER): Payer: PRIVATE HEALTH INSURANCE | Admitting: Endocrinology

## 2014-10-15 VITALS — BP 110/60 | HR 77 | Temp 98.4°F | Ht 63.0 in | Wt 129.0 lb

## 2014-10-15 DIAGNOSIS — R2241 Localized swelling, mass and lump, right lower limb: Secondary | ICD-10-CM | POA: Diagnosis not present

## 2014-10-15 DIAGNOSIS — D1723 Benign lipomatous neoplasm of skin and subcutaneous tissue of right leg: Secondary | ICD-10-CM | POA: Insufficient documentation

## 2014-10-15 MED ORDER — FLUOXETINE HCL 20 MG PO CAPS: 20.0000 mg | ORAL_CAPSULE | Freq: Every day | ORAL | Status: DC

## 2014-10-15 MED ORDER — ISOMETHEPTENE-DICHLORAL-APAP 65-100-325 MG PO CAPS: 1.0000 | ORAL_CAPSULE | Freq: Four times a day (QID) | ORAL | Status: DC | PRN

## 2014-10-15 MED ORDER — METHOCARBAMOL 500 MG PO TABS: ORAL_TABLET | ORAL | Status: DC

## 2014-10-15 NOTE — Progress Notes (Signed)
   Subjective:    Patient ID: Sarah Reid, female    DOB: 09-Sep-1955, 59 y.o.   MRN: [id number]  HPI Headache: pt says she gets approx 2 severe headaches per year.  She gets approx 1 milder headache per month.  She says midrin works well.   allergic rhinitis: nasacort+benadryl work well.   Depression: she says prozac works well.   Pt states 1 year of enlarging mass at the right posterior thigh.  No assoc pain.  Pt declines cpx, labs, and vaccinations.  Past Medical History  Diagnosis Date  . FIBROIDS, UTERUS 11/07/2008  . GOITER, MULTINODULAR 11/07/2008  . HYPERLIPIDEMIA 02/18/2007  . LEUKOPENIA, CHRONIC 11/07/2008  . DEPRESSION 11/07/2008  . BRADYCARDIA 11/07/2008  . ALLERGIC RHINITIS 02/18/2007  . Headache(784.0) 11/07/2008  . ASYMPTOMATIC POSTMENOPAUSAL STATUS 11/07/2008    Past Surgical History  Procedure Laterality Date  . Abdominal hysterectomy  2001    History   Social History  . Marital Status: Single    Spouse Name: N/A  . Number of Children: N/A  . Years of Education: N/A   Occupational History  . Unemployed    Social History Main Topics  . Smoking status: Never Smoker   . Smokeless tobacco: Not on file  . Alcohol Use: Not on file  . Drug Use: Not on file  . Sexual Activity: Not on file   Other Topics Concern  . Not on file   Social History Narrative    Current Outpatient Prescriptions on File Prior to Visit  Medication Sig Dispense Refill  . calcium carbonate (TUMS - DOSED IN MG ELEMENTAL CALCIUM) 500 MG chewable tablet Chew 1 tablet by mouth daily.      . DiphenhydrAMINE HCl (BENADRYL ALLERGY PO) Take 2 tablets by mouth daily.    . fluticasone (FLONASE) 50 MCG/ACT nasal spray Place 2 sprays into the nose daily. 16 g 6  . Ibuprofen ([facility] IBUPROFEN) 200 MG CAPS Take 1 capsule by mouth as needed.       No current facility-administered medications on file prior to visit.    No Known Allergies  Family History  Problem Relation Age of Onset  . Cancer Neg  Hx   . Thyroid disease Neg Hx     BP 110/60 mmHg  Pulse 77  Temp(Src) 98.4 F (36.9 C) (Oral)  Ht 5\' 3"  (1.6 m)  Wt 129 lb (58.514 kg)  BMI 22.86 kg/m2  SpO2 98%  Review of Systems Denies earache, but she has intermittent tinnitus.    Objective:   Physical Exam VITAL SIGNS:  See vs page GENERAL: no distress head: no deformity eyes: no periorbital swelling, no proptosis external nose and ears are normal mouth: no lesion seen. Both eac's and tm's are normal. PSYCH: Alert and well-oriented.  Does not appear anxious nor depressed.   Right post thigh, half way between hip and knee: approx 7 cm soft tissue mass.  nontender.      Assessment & Plan:  Soft-tissue mass, new, uncertain etiology Depression: well-controlled Headache: well-controlled Allergic rhinitis, stable.    Patient is advised the following: Patient Instructions  Please continue the same medications. Please see an orthopedic specialist.  you will receive a phone call, about a day and time for an appointment Please return in 1 year.

## 2014-10-15 NOTE — Patient Instructions (Addendum)
Please continue the same medications. Please see an orthopedic specialist.  you will receive a phone call, about a day and time for an appointment Please return in 1 year.

## 2014-11-11 ENCOUNTER — Telehealth: Payer: Self-pay | Admitting: Endocrinology

## 2014-11-11 NOTE — Telephone Encounter (Signed)
please call patient: Ins wants to know what other meds you have tried for headache, and what year was it?

## 2014-11-12 NOTE — Telephone Encounter (Signed)
Requested call back to discuss.  

## 2014-11-13 ENCOUNTER — Telehealth: Payer: Self-pay | Admitting: Endocrinology

## 2014-11-13 NOTE — Telephone Encounter (Signed)
Pt returning your call

## 2014-11-13 NOTE — Telephone Encounter (Signed)
Requested call back from the patient to discuss.  

## 2014-11-13 NOTE — Telephone Encounter (Signed)
Left voicemail advising patient to call back and let us know what medications she has tried in the past for headaches so we can list them on her prior authorization for the midrin.

## 2014-11-14 DIAGNOSIS — Z0289 Encounter for other administrative examinations: Secondary | ICD-10-CM

## 2014-11-14 NOTE — Telephone Encounter (Signed)
Patient called back and stated the only medication she has tried in the past for her migraines is Frovatriptan. Patient stated this medication did not work for her and she took this medication about 2 years ago.

## 2015-04-07 ENCOUNTER — Telehealth: Payer: Self-pay | Admitting: Endocrinology

## 2015-04-07 NOTE — Telephone Encounter (Signed)
please call patient: i have reviewed: It was coded as e and m.   Please ask pro-fee about this.  i can talk to them if necessary

## 2015-04-07 NOTE — Telephone Encounter (Signed)
Pt states that the visit on 10/15/14 should have been coded as an annual wellness visit. Can you please look into this for [place]? She states she did not see [place] only for the lump on her leg. I see she did decline the CPX, labs, and vaccines.

## 2015-04-07 NOTE — Telephone Encounter (Signed)
Left pt a VM stating that the information for visit on 10/15/2014 has been updated

## 2015-05-02 ENCOUNTER — Telehealth: Payer: Self-pay

## 2015-05-02 MED ORDER — ISOMETHEPTENE-DICHLORAL-APAP 65-100-325 MG PO CAPS: 1.0000 | ORAL_CAPSULE | Freq: Four times a day (QID) | ORAL | Status: DC | PRN

## 2015-05-02 NOTE — Telephone Encounter (Signed)
i printed 

## 2015-05-02 NOTE — Telephone Encounter (Signed)
Rx sent per pt's request.  

## 2015-05-02 NOTE — Telephone Encounter (Signed)
Received a refill request for Midrin. Last office visit was 10/15/2014. Please advise if ok to refill.

## 2015-05-05 ENCOUNTER — Telehealth: Payer: Self-pay | Admitting: Endocrinology

## 2015-05-05 NOTE — Telephone Encounter (Signed)
I contacted the pt and requested a call back to verify her medication.

## 2015-05-05 NOTE — Telephone Encounter (Signed)
[facility] # [phone] the [facility]hep

## 2015-05-06 NOTE — Telephone Encounter (Signed)
Rx has been submitted to [facility] stating the quantity and day supply is correct and to please dispense the medication.

## 2016-02-12 ENCOUNTER — Other Ambulatory Visit: Payer: Self-pay | Admitting: Endocrinology

## 2017-04-28 ENCOUNTER — Other Ambulatory Visit: Payer: Self-pay | Admitting: Endocrinology

## 2018-02-02 ENCOUNTER — Encounter: Payer: Self-pay | Admitting: Endocrinology

## 2018-02-02 ENCOUNTER — Ambulatory Visit: Payer: 59 | Admitting: Endocrinology

## 2018-02-02 VITALS — BP 108/68 | HR 64 | Temp 98.5°F | Ht 63.0 in | Wt 133.0 lb

## 2018-02-02 DIAGNOSIS — Z0000 Encounter for general adult medical examination without abnormal findings: Secondary | ICD-10-CM | POA: Diagnosis not present

## 2018-02-02 DIAGNOSIS — D649 Anemia, unspecified: Secondary | ICD-10-CM | POA: Insufficient documentation

## 2018-02-02 LAB — CBC WITH DIFFERENTIAL/PLATELET
BASOS ABS: 0.1 10*3/uL (ref 0.0–0.1)
Basophils Relative: 1.1 % (ref 0.0–3.0)
EOS PCT: 1.9 % (ref 0.0–5.0)
Eosinophils Absolute: 0.1 10*3/uL (ref 0.0–0.7)
HEMATOCRIT: 35.5 % — AB (ref 36.0–46.0)
Hemoglobin: 11.8 g/dL — ABNORMAL LOW (ref 12.0–15.0)
LYMPHS PCT: 37.9 % (ref 12.0–46.0)
Lymphs Abs: 1.8 10*3/uL (ref 0.7–4.0)
MCHC: 33.2 g/dL (ref 30.0–36.0)
MCV: 87.8 fl (ref 78.0–100.0)
MONOS PCT: 8 % (ref 3.0–12.0)
Monocytes Absolute: 0.4 10*3/uL (ref 0.1–1.0)
Neutro Abs: 2.4 10*3/uL (ref 1.4–7.7)
Neutrophils Relative %: 51.1 % (ref 43.0–77.0)
Platelets: 224 10*3/uL (ref 150.0–400.0)
RBC: 4.05 Mil/uL (ref 3.87–5.11)
RDW: 14.1 % (ref 11.5–15.5)
WBC: 4.8 10*3/uL (ref 4.0–10.5)

## 2018-02-02 LAB — BASIC METABOLIC PANEL
BUN: 12 mg/dL (ref 6–23)
CO2: 29 mEq/L (ref 19–32)
CREATININE: 0.77 mg/dL (ref 0.40–1.20)
Calcium: 9.8 mg/dL (ref 8.4–10.5)
Chloride: 105 mEq/L (ref 96–112)
GFR: 80.12 mL/min (ref >60.00)
Glucose, Bld: 98 mg/dL (ref 70–99)
Potassium: 4.6 mEq/L (ref 3.5–5.1)
Sodium: 140 mEq/L (ref 135–145)

## 2018-02-02 LAB — TSH: TSH: 1.78 u[IU]/mL (ref 0.35–4.50)

## 2018-02-02 LAB — LIPID PANEL
CHOL/HDL RATIO: 5
Cholesterol: 302 mg/dL — ABNORMAL HIGH (ref 0–200)
HDL: 59.3 mg/dL (ref >39.00)
LDL Cholesterol: 227 mg/dL — ABNORMAL HIGH (ref 0–99)
NONHDL: 242.89
TRIGLYCERIDES: 80 mg/dL (ref 0.0–149.0)
VLDL: 16 mg/dL (ref 0.0–40.0)

## 2018-02-02 LAB — HEPATIC FUNCTION PANEL
ALBUMIN: 4.7 g/dL (ref 3.5–5.2)
ALK PHOS: 72 U/L (ref 39–117)
ALT: 11 U/L (ref 0–35)
AST: 17 U/L (ref 0–37)
Bilirubin, Direct: 0 mg/dL (ref 0.0–0.3)
TOTAL PROTEIN: 7.3 g/dL (ref 6.0–8.3)
Total Bilirubin: 0.3 mg/dL (ref 0.2–1.2)

## 2018-02-02 LAB — SEDIMENTATION RATE: SED RATE: 27 mm/h (ref 0–30)

## 2018-02-02 MED ORDER — ISOMETHEPTENE-DICHLORAL-APAP 65-100-325 MG PO CAPS: 1.0000 | ORAL_CAPSULE | Freq: Four times a day (QID) | ORAL | 5 refills | Status: DC | PRN

## 2018-02-02 MED ORDER — TERBINAFINE HCL 250 MG PO TABS: 250.0000 mg | ORAL_TABLET | Freq: Every day | ORAL | 0 refills | Status: DC

## 2018-02-02 MED ORDER — FLUOXETINE HCL 20 MG PO CAPS: 20.0000 mg | ORAL_CAPSULE | Freq: Every day | ORAL | 11 refills | Status: DC

## 2018-02-02 NOTE — Patient Instructions (Addendum)
blood tests are requested for you today.  We'll let you know about the results. I have sent prescriptions to your pharmacy, to refill your medications, and for the nail condition.

## 2018-02-02 NOTE — Progress Notes (Signed)
Subjective:    Patient ID: Sarah Reid, female    DOB: 04-06-1956, 62 y.o.   MRN: [id number]  HPI  The state of at least three ongoing medical problems is addressed today, with interval history of each noted here: Headache: she seldon requires midrin.  This is a stable problem. Depression: she says she has more anxiety if she misses the prozac.  This is a stable problem. Multinodular goiter: pt says she does not notice.  This is a stable problem. Fingernail fungus has recurred.  This is a stable problem. Past Medical History:  Diagnosis Date  . ALLERGIC RHINITIS 02/18/2007  . ASYMPTOMATIC POSTMENOPAUSAL STATUS 11/07/2008  . BRADYCARDIA 11/07/2008  . DEPRESSION 11/07/2008  . FIBROIDS, UTERUS 11/07/2008  . GOITER, MULTINODULAR 11/07/2008  . Headache(784.0) 11/07/2008  . HYPERLIPIDEMIA 02/18/2007  . LEUKOPENIA, CHRONIC 11/07/2008    Past Surgical History:  Procedure Laterality Date  . ABDOMINAL HYSTERECTOMY  2001    Social History   Socioeconomic History  . Marital status: Single    Spouse name: Not on file  . Number of children: Not on file  . Years of education: Not on file  . Highest education level: Not on file  Occupational History  . Occupation: Unemployed  Social Needs  . Financial resource strain: Not on file  . Food insecurity:    Worry: Not on file    Inability: Not on file  . Transportation needs:    Medical: Not on file    Non-medical: Not on file  Tobacco Use  . Smoking status: Never Smoker  . Smokeless tobacco: Never Used  Substance and Sexual Activity  . Alcohol use: Not on file  . Drug use: Not on file  . Sexual activity: Not on file  Lifestyle  . Physical activity:    Days per week: Not on file    Minutes per session: Not on file  . Stress: Not on file  Relationships  . Social connections:    Talks on phone: Not on file    Gets together: Not on file    Attends religious service: Not on file    Active member of club or organization: Not on file    Attends meetings of clubs or organizations: Not on file    Relationship status: Not on file  . Intimate partner violence:    Fear of current or ex partner: Not on file    Emotionally abused: Not on file    Physically abused: Not on file    Forced sexual activity: Not on file  Other Topics Concern  . Not on file  Social History Narrative  . Not on file    Current Outpatient Medications on File Prior to Visit  Medication Sig Dispense Refill  . calcium carbonate (TUMS - DOSED IN MG ELEMENTAL CALCIUM) 500 MG chewable tablet Chew 1 tablet by mouth daily.      . DiphenhydrAMINE HCl (BENADRYL ALLERGY PO) Take 2 tablets by mouth daily.    . fluticasone (FLONASE) 50 MCG/ACT nasal spray Place 2 sprays into the nose daily. 16 g 6  . Ibuprofen ([facility] IBUPROFEN) 200 MG CAPS Take 1 capsule by mouth as needed.      . methocarbamol (ROBAXIN) 500 MG tablet TAKE 1 TABLET (500 MG TOTAL) BY MOUTH DAILY AS NEEDED FOR HEADACHE 50 tablet 11   No current facility-administered medications on file prior to visit.     No Known Allergies  Family History  Problem Relation Age of Onset  .  Cancer Neg Hx   . Thyroid disease Neg Hx     BP 108/68 (BP Location: Right Arm, Patient Position: Sitting, Cuff Size: Normal)   Pulse 64   Temp 98.5 F (36.9 C) (Oral)   Ht 5\' 3"  (1.6 m)   Wt 133 lb (60.3 kg)   SpO2 98%   BMI 23.56 kg/m   Review of Systems She has slight "choking" sensation at the ant neck.  Denies insomnia.      Objective:   Physical Exam VITAL SIGNS:  See vs page GENERAL: no distress NECK: thyrois is 2-3 times normal size (R>L), with irreg surface.   PSYCH: Alert and well-oriented.  Does not appear anxious nor depressed.   Fingernails: There is bilateral onychomycosis.       Assessment & Plan:  Headache: well-controlled.  Please continue the same medication Depression: well-controlled. Please continue the same medication Multinodular goiter: clinically stable.  Fingernail fungus:  recurrent.   Patient Instructions  blood tests are requested for you today.  We'll let you know about the results. I have sent prescriptions to your pharmacy, to refill your medications, and for the nail condition.

## 2018-02-13 ENCOUNTER — Telehealth: Payer: Self-pay | Admitting: Endocrinology

## 2018-02-13 ENCOUNTER — Other Ambulatory Visit (INDEPENDENT_AMBULATORY_CARE_PROVIDER_SITE_OTHER): Payer: 59

## 2018-02-13 DIAGNOSIS — D649 Anemia, unspecified: Secondary | ICD-10-CM | POA: Diagnosis not present

## 2018-02-13 LAB — IBC PANEL
Iron: 97 ug/dL (ref 42–145)
Saturation Ratios: 23.6 % (ref 20.0–50.0)
Transferrin: 293 mg/dL (ref 212.0–360.0)

## 2018-02-13 NOTE — Telephone Encounter (Signed)
Patient returned call. Please call patient at ph# [phone]

## 2018-02-15 NOTE — Telephone Encounter (Signed)
I have returned patient's call & LVM to call back.

## 2018-04-26 ENCOUNTER — Other Ambulatory Visit: Payer: Self-pay | Admitting: Endocrinology

## 2018-04-28 ENCOUNTER — Other Ambulatory Visit: Payer: Self-pay | Admitting: Endocrinology

## 2018-05-30 ENCOUNTER — Other Ambulatory Visit: Payer: Self-pay | Admitting: Endocrinology

## 2018-05-30 NOTE — Telephone Encounter (Signed)
Please refill x 3 months Further refills would have to be considered by new PCP   

## 2018-08-29 ENCOUNTER — Other Ambulatory Visit: Payer: Self-pay | Admitting: Endocrinology

## 2019-04-27 ENCOUNTER — Other Ambulatory Visit: Payer: Self-pay

## 2019-04-30 ENCOUNTER — Ambulatory Visit (INDEPENDENT_AMBULATORY_CARE_PROVIDER_SITE_OTHER): Payer: Medicare HMO | Admitting: Nurse Practitioner

## 2019-04-30 ENCOUNTER — Encounter: Payer: Self-pay | Admitting: Nurse Practitioner

## 2019-04-30 ENCOUNTER — Other Ambulatory Visit: Payer: Self-pay

## 2019-04-30 VITALS — BP 114/78 | HR 64 | Temp 96.9°F | Ht 63.0 in | Wt 129.2 lb

## 2019-04-30 DIAGNOSIS — E042 Nontoxic multinodular goiter: Secondary | ICD-10-CM

## 2019-04-30 DIAGNOSIS — D649 Anemia, unspecified: Secondary | ICD-10-CM

## 2019-04-30 DIAGNOSIS — E782 Mixed hyperlipidemia: Secondary | ICD-10-CM

## 2019-04-30 DIAGNOSIS — Z23 Encounter for immunization: Secondary | ICD-10-CM | POA: Diagnosis not present

## 2019-04-30 DIAGNOSIS — Z780 Asymptomatic menopausal state: Secondary | ICD-10-CM | POA: Diagnosis not present

## 2019-04-30 DIAGNOSIS — B351 Tinea unguium: Secondary | ICD-10-CM | POA: Diagnosis not present

## 2019-04-30 DIAGNOSIS — G43019 Migraine without aura, intractable, without status migrainosus: Secondary | ICD-10-CM | POA: Diagnosis not present

## 2019-04-30 DIAGNOSIS — Z1231 Encounter for screening mammogram for malignant neoplasm of breast: Secondary | ICD-10-CM

## 2019-04-30 DIAGNOSIS — F325 Major depressive disorder, single episode, in full remission: Secondary | ICD-10-CM | POA: Insufficient documentation

## 2019-04-30 DIAGNOSIS — R69 Illness, unspecified: Secondary | ICD-10-CM | POA: Diagnosis not present

## 2019-04-30 LAB — COMPREHENSIVE METABOLIC PANEL
ALT: 7 U/L (ref 0–35)
AST: 12 U/L (ref 0–37)
Albumin: 4.6 g/dL (ref 3.5–5.2)
Alkaline Phosphatase: 79 U/L (ref 39–117)
BUN: 14 mg/dL (ref 6–23)
CO2: 29 mEq/L (ref 19–32)
Calcium: 9.4 mg/dL (ref 8.4–10.5)
Chloride: 102 mEq/L (ref 96–112)
Creatinine, Ser: 0.71 mg/dL (ref 0.40–1.20)
GFR: 82.46 mL/min (ref >60.00)
Glucose, Bld: 93 mg/dL (ref 70–99)
Potassium: 4.2 mEq/L (ref 3.5–5.1)
Sodium: 138 mEq/L (ref 135–145)
Total Bilirubin: 0.3 mg/dL (ref 0.2–1.2)
Total Protein: 6.8 g/dL (ref 6.0–8.3)

## 2019-04-30 LAB — CBC WITH DIFFERENTIAL/PLATELET
Basophils Absolute: 0 10*3/uL (ref 0.0–0.1)
Basophils Relative: 0.8 % (ref 0.0–3.0)
Eosinophils Absolute: 0.1 10*3/uL (ref 0.0–0.7)
Eosinophils Relative: 1.2 % (ref 0.0–5.0)
HCT: 34.4 % — ABNORMAL LOW (ref 36.0–46.0)
Hemoglobin: 11.6 g/dL — ABNORMAL LOW (ref 12.0–15.0)
Lymphocytes Relative: 37.1 % (ref 12.0–46.0)
Lymphs Abs: 1.7 10*3/uL (ref 0.7–4.0)
MCHC: 33.7 g/dL (ref 30.0–36.0)
MCV: 88.1 fl (ref 78.0–100.0)
Monocytes Absolute: 0.4 10*3/uL (ref 0.1–1.0)
Monocytes Relative: 8 % (ref 3.0–12.0)
Neutro Abs: 2.4 10*3/uL (ref 1.4–7.7)
Neutrophils Relative %: 52.9 % (ref 43.0–77.0)
Platelets: 227 10*3/uL (ref 150.0–400.0)
RBC: 3.9 Mil/uL (ref 3.87–5.11)
RDW: 13.4 % (ref 11.5–15.5)
WBC: 4.5 10*3/uL (ref 4.0–10.5)

## 2019-04-30 LAB — LIPID PANEL
Cholesterol: 339 mg/dL — ABNORMAL HIGH (ref 0–200)
HDL: 53.4 mg/dL (ref >39.00)
LDL Cholesterol: 260 mg/dL — ABNORMAL HIGH (ref 0–99)
NonHDL: 285.52
Total CHOL/HDL Ratio: 6
Triglycerides: 129 mg/dL (ref 0.0–149.0)
VLDL: 25.8 mg/dL (ref 0.0–40.0)

## 2019-04-30 LAB — TSH: TSH: 1.6 u[IU]/mL (ref 0.35–4.50)

## 2019-04-30 MED ORDER — TERBINAFINE HCL 250 MG PO TABS: 250.0000 mg | ORAL_TABLET | Freq: Every day | ORAL | 0 refills | Status: DC

## 2019-04-30 MED ORDER — BUTALBITAL-APAP-CAFFEINE 50-325-40 MG PO TABS: 1.0000 | ORAL_TABLET | Freq: Four times a day (QID) | ORAL | 0 refills | Status: DC | PRN

## 2019-04-30 MED ORDER — FLUOXETINE HCL 20 MG PO CAPS: 20.0000 mg | ORAL_CAPSULE | Freq: Every day | ORAL | 3 refills | Status: DC

## 2019-04-30 NOTE — Progress Notes (Signed)
Subjective:  Patient ID: Sarah Reid, female    DOB: 1956/04/03  Age: 63 y.o. MRN: [id number]  CC: Establish Care (est care/ thump nails come off--request rx for this and refill med: prozac and headache med?)  Depression      The patient presents with depression.  This is a chronic problem.  The current episode started more than 1 year ago (66yrs).   The onset quality is gradual.   The problem occurs constantly.  The problem has been gradually improving since onset.  Associated symptoms include fatigue, insomnia and headaches.  Associated symptoms include no decreased concentration, no helplessness, no hopelessness, not irritable, no restlessness, no decreased interest, no appetite change, no body aches, no myalgias, no indigestion, not sad and no suicidal ideas.     The symptoms are aggravated by medication and work stress.  Past treatments include SSRIs - Selective serotonin reuptake inhibitors.  Compliance with treatment is good.  Previous treatment provided significant relief.  Risk factors include family history of mental illness.   Past medical history includes thyroid problem, anxiety and depression.     Pertinent negatives include no chronic pain, no recent illness, no brain trauma, no dementia, no eating disorder, no mental health disorder, no obsessive-compulsive disorder, no post-traumatic stress disorder, no suicide attempts and no head trauma. Migraine  This is a chronic problem. The current episode started more than 1 year ago (56yrs). The problem occurs constantly. The problem has been unchanged. The pain is located in the right unilateral and occipital region. The pain radiates to the right neck and left neck. The pain quality is similar to prior headaches. The quality of the pain is described as aching, band-like and throbbing. The pain is moderate. Associated symptoms include insomnia, nausea, neck pain and photophobia. Pertinent negatives include no abdominal pain, abnormal behavior,  anorexia, back pain, blurred vision, coughing, dizziness, drainage, eye pain, eye redness, eye watering, loss of balance, muscle aches, numbness, phonophobia, rhinorrhea, scalp tenderness, seizures, sinus pressure, tingling, tinnitus, visual change, vomiting or weakness. The symptoms are aggravated by unknown. She has tried NSAIDs, acetaminophen and darkened room (midrin) for the symptoms. The treatment provided moderate relief. Her past medical history is significant for migraine headaches. There is no history of hypertension, immunosuppression, obesity, pseudotumor cerebri, recent head traumas or sinus disease.  no previous head CT or MRI. Last eye exam 2019: use of corrective lens, normal pressure per patient. Has headache once a week, use of ibuprofen 400-600mg  3-4x/day. Used midrin in past with significant improvement but unable to afford at this time, reports it cost $350. Reports she if is not interested in any head scan or use of preventive medication  She also reports recurrent thumb nail changes: thick, dark color, detached from nail bed. She has used Lamisil in past with improvement. She denies any toenail fungus or tinea pedis. She has pets at home but denies any skin disorder.  Hx of goiter: followed by Dr. Loanne Drilling, last seen 01/2018, last thyroid US 04/2006: FNA recommended. Biopsy done 2004: benign per Dr. Cordelia Pen note. She declined any further thyroid US at this time. Denies any dysphagia or hoarseness or weight loss or palpitations.  She is not inserted in any colon cancer screen. Denies any change in GI function, no melena, no hematochezia, no nausea, no weight loss, no rectal pain. Hx of anemia with normal iron panel 01/2018.  Depression screen [facility] 2/9 04/30/2019 04/30/2019  Decreased Interest 0 0  Down, Depressed, Hopeless 0 0  PHQ - 2 Score 0 0  Altered sleeping 1 -  Tired, decreased energy 1 -  Change in appetite 0 -  Feeling bad or failure about yourself  0 -  Trouble  concentrating 0 -  Moving slowly or fidgety/restless 0 -  Suicidal thoughts 0 -  PHQ-9 Score 2 -  Difficult doing work/chores Not difficult at all -   GAD 7 : Generalized Anxiety Score 04/30/2019  Nervous, Anxious, on Edge 1  Control/stop worrying 0  Worry too much - different things 0  Trouble relaxing 1  Restless 0  Easily annoyed or irritable 1  Afraid - awful might happen 0  Total GAD 7 Score 3  Anxiety Difficulty Not difficult at all    Reviewed past Medical, Social and Family history today.  Outpatient Medications Prior to Visit  Medication Sig Dispense Refill  . calcium carbonate (TUMS - DOSED IN MG ELEMENTAL CALCIUM) 500 MG chewable tablet Chew 1 tablet by mouth daily.      . DiphenhydrAMINE HCl (BENADRYL ALLERGY PO) Take 2 tablets by mouth daily.    . fluticasone (FLONASE) 50 MCG/ACT nasal spray Place 2 sprays into the nose daily. 16 g 6  . FLUoxetine (PROZAC) 20 MG capsule TAKE 1 CAPSULE BY MOUTH EVERY DAY 90 capsule 0  . Ibuprofen ([facility] IBUPROFEN) 200 MG CAPS Take 1 capsule by mouth as needed.      . terbinafine (LAMISIL) 250 MG tablet TAKE 1 TABLET BY MOUTH EVERY DAY 30 tablet 0  . isometheptene-acetaminophen-dichloralphenazone (MIDRIN) 65-100-325 MG capsule Take 1 capsule by mouth 4 (four) times daily as needed for migraine. Max 5 capsules in 12 hours. (Patient not taking: Reported on 04/30/2019) 30 capsule 5  . methocarbamol (ROBAXIN) 500 MG tablet TAKE 1 TABLET (500 MG TOTAL) BY MOUTH DAILY AS NEEDED FOR HEADACHE (Patient not taking: Reported on 04/30/2019) 50 tablet 11   No facility-administered medications prior to visit.     ROS See HPI  Objective:  BP 114/78   Pulse 64   Temp (!) 96.9 F (36.1 C) (Tympanic)   Ht 5\' 3"  (1.6 m)   Wt 129 lb 3.2 oz (58.6 kg)   SpO2 97%   BMI 22.89 kg/m   BP Readings from Last 3 Encounters:  04/30/19 114/78  02/02/18 108/68  10/15/14 110/60    Wt Readings from Last 3 Encounters:  04/30/19 129 lb 3.2 oz (58.6 kg)   02/02/18 133 lb (60.3 kg)  10/15/14 129 lb (58.5 kg)    Physical Exam Constitutional:      General: She is not irritable. Neck:     Musculoskeletal: Normal range of motion and neck supple.     Thyroid: Thyroid mass and thyromegaly present. No thyroid tenderness.   Cardiovascular:     Rate and Rhythm: Normal rate and regular rhythm.     Pulses: Normal pulses.     Heart sounds: Normal heart sounds.  Pulmonary:     Effort: Pulmonary effort is normal.     Breath sounds: Normal breath sounds.  Musculoskeletal:     Right lower leg: No edema.     Left lower leg: No edema.  Lymphadenopathy:     Cervical: No cervical adenopathy.  Skin:    General: Skin is warm and dry.     Findings: No erythema or rash.  Neurological:     Mental Status: She is alert and oriented to person, place, and time.     Cranial Nerves: No cranial nerve deficit.  Psychiatric:  Mood and Affect: Mood normal.        Behavior: Behavior normal.        Thought Content: Thought content normal.     Lab Results  Component Value Date   WBC 4.5 04/30/2019   HGB 11.6 (L) 04/30/2019   HCT 34.4 (L) 04/30/2019   PLT 227.0 04/30/2019   GLUCOSE 93 04/30/2019   CHOL 339 (H) 04/30/2019   TRIG 129.0 04/30/2019   HDL 53.40 04/30/2019   LDLDIRECT 273.7 11/07/2008   LDLCALC 260 (H) 04/30/2019   ALT 7 04/30/2019   AST 12 04/30/2019   NA 138 04/30/2019   K 4.2 04/30/2019   CL 102 04/30/2019   CREATININE 0.71 04/30/2019   BUN 14 04/30/2019   CO2 29 04/30/2019   TSH 1.60 04/30/2019    Assessment & Plan:   Nallah was seen today for establish care.  Diagnoses and all orders for this visit:  Intractable migraine without aura and without status migrainosus -     butalbital-acetaminophen-caffeine (FIORICET) 50-325-40 MG tablet; Take 1 tablet by mouth every 6 (six) hours as needed for headache.  Anemia, unspecified type -     CBC with Differential/Platelet  Mixed hyperlipidemia -     CMP -     HTN_4 Lipid  panel  Need for pneumococcal vaccination -     Pneumococcal conjugate vaccine 13-valent  Need for influenza vaccination -     Flu Vaccine QUAD High Dose(Fluad)  GOITER, MULTINODULAR -     TSH  Onychomycosis -     CMP -     terbinafine (LAMISIL) 250 MG tablet; Take 1 tablet (250 mg total) by mouth daily. Needs repeat hepatic panel for additional refills -     Hepatic function panel; Future  Major depressive disorder with single episode, in full remission ([place]) -     FLUoxetine (PROZAC) 20 MG capsule; Take 1 capsule (20 mg total) by mouth daily.  Breast cancer screening by mammogram -     MM DIGITAL SCREENING BILATERAL; Future  Asymptomatic age-related postmenopausal state -     DG Bone Density; Future   I have discontinued Marlys B. Schwall's Ibuprofen, methocarbamol, and isometheptene-acetaminophen-dichloralphenazone. I have also changed her FLUoxetine and terbinafine. Additionally, I am having her start on butalbital-acetaminophen-caffeine. Lastly, I am having her maintain her calcium carbonate, DiphenhydrAMINE HCl (BENADRYL ALLERGY PO), and fluticasone.  Meds ordered this encounter  Medications  . FLUoxetine (PROZAC) 20 MG capsule    Sig: Take 1 capsule (20 mg total) by mouth daily.    Dispense:  90 capsule    Refill:  3    Order Specific Question:   Supervising Provider    Answer:   Lucille Passy [[id number]]  . terbinafine (LAMISIL) 250 MG tablet    Sig: Take 1 tablet (250 mg total) by mouth daily. Needs repeat hepatic panel for additional refills    Dispense:  30 tablet    Refill:  0    Needs repeat hepatic panel for additional refills    Order Specific Question:   Supervising Provider    Answer:   Lucille Passy [[id number]]  . butalbital-acetaminophen-caffeine (FIORICET) 50-325-40 MG tablet    Sig: Take 1 tablet by mouth every 6 (six) hours as needed for headache.    Dispense:  12 tablet    Refill:  0    Order Specific Question:   Supervising Provider    Answer:   Lucille Passy  [[id number]]    Problem List Items  Addressed This Visit      Cardiovascular and Mediastinum   Intractable migraine without aura and without status migrainosus - Primary    Chronic, onset at age 56,  no change in characteristics: unilateral, right side, behind eye, occasionally radiates to back of head and neck, occasionally associated with nausea and photophobia. Denies any previous head injury or seizure. no previous head CT or MRI obtained Has headache once a week, use of ibuprofen 400-600mg  3-4x/day. Used midrin in past with significant improvement but unable to afford at this time, reports it cost $350. Reports she if is not interested in any head scan or use of preventive medication at this time. She is aware of potential risk of GI bleed and renal insufficiency due to overuse of NSAIDs. I also advised her about risk of rebound headache with overuse of imitrex or fioricet. I advised her about the need for head CT and/or referral to neurology. She verbalized understanding. She declined referral to neurology, head CT, and use of preventative medication.  Normal BP and neuro exam Check CMP and TSH today. Send fioricet rx and review of lab results. She was informed that Rx will be limited to no more than 12tabs every 30days. Advised to limit ibuprofen use of 400mg  ever 8hrs as needed with food and do not take at same time with fioricet.         Relevant Medications   FLUoxetine (PROZAC) 20 MG capsule   butalbital-acetaminophen-caffeine (FIORICET) 50-325-40 MG tablet     Endocrine   GOITER, MULTINODULAR    Hx of goiter: followed by Dr. Loanne Drilling, last seen 01/2018, last thyroid US 04/2006: FNA recommended. Biopsy done 2004: benign per Dr. Cordelia Pen note. She declined any further thyroid US at this time. Denies any dysphagia or hoarseness or weight loss or palpitations.  Repeat TSH today      Relevant Orders   TSH (Completed)     Musculoskeletal and Integument   Onychomycosis    Relevant Medications   terbinafine (LAMISIL) 250 MG tablet   Other Relevant Orders   CMP (Completed)   Hepatic function panel     Other   Anemia   Relevant Orders   CBC with Differential/Platelet (Completed)   Asymptomatic age-related postmenopausal state   Relevant Orders   DG Bone Density   Hyperlipidemia   Relevant Orders   CMP (Completed)   HTN_4 Lipid panel (Completed)   Major depressive disorder with single episode, in full remission ([place])           Relevant Medications   FLUoxetine (PROZAC) 20 MG capsule    Other Visit Diagnoses    Need for pneumococcal vaccination       Relevant Orders   Pneumococcal conjugate vaccine 13-valent (Completed)   Need for influenza vaccination       Relevant Orders   Flu Vaccine QUAD High Dose(Fluad) (Completed)   Breast cancer screening by mammogram       Relevant Orders   MM DIGITAL SCREENING BILATERAL      Follow-up: Return in about 6 months (around 10/28/2019) for anxiety and depression.  Wilfred Lacy, NP

## 2019-04-30 NOTE — Patient Instructions (Addendum)
Normal TSH, renal and liver function. Stable cbc with mild anemia. Let me know if your change your mind about cologuard or colonoscopy. Abnormal lipid panel: persistent elevation in total cholesterol and LDL. You have a low risk to develop cardiovascular disease over the next 67yrs, but I recommend heart healthy diet in order to keep numbers from worsening. lamisil and fioricet rx sent You need to return to lab in 59month for repeat hepatic panel prior to additional lamisil refill  Do not take ibuprofen or advil or aleve or naproxen or aspirin  While taking fioricet..  Let me know if you change you mind about colonoscopy or cologuard.   you will be contacted to schedule appt for mammogram and bone density.

## 2019-04-30 NOTE — Assessment & Plan Note (Addendum)
Chronic, onset at age 63,  no change in characteristics: unilateral, right side, behind eye, occasionally radiates to back of head and neck, occasionally associated with nausea and photophobia. Denies any previous head injury or seizure. no previous head CT or MRI obtained Has headache once a week, use of ibuprofen 400-600mg  3-4x/day. Used midrin in past with significant improvement but unable to afford at this time, reports it cost $350. Reports she if is not interested in any head scan or use of preventive medication at this time. She is aware of potential risk of GI bleed and renal insufficiency due to overuse of NSAIDs. I also advised her about risk of rebound headache with overuse of imitrex or fioricet. I advised her about the need for head CT and/or referral to neurology. She verbalized understanding. She declined referral to neurology, head CT, and use of preventative medication.  Normal BP and neuro exam Check CMP and TSH today. Send fioricet rx and review of lab results. She was informed that Rx will be limited to no more than 12tabs every 30days. Advised to limit ibuprofen use of 400mg  ever 8hrs as needed with food and do not take at same time with fioricet.

## 2019-04-30 NOTE — Assessment & Plan Note (Signed)
Hx of goiter: followed by Dr. Loanne Drilling, last seen 01/2018, last thyroid US 04/2006: FNA recommended. Biopsy done 2004: benign per Dr. Cordelia Pen note. She declined any further thyroid US at this time. Denies any dysphagia or hoarseness or weight loss or palpitations.  Repeat TSH today

## 2019-05-01 ENCOUNTER — Ambulatory Visit: Payer: Self-pay

## 2019-05-01 NOTE — Telephone Encounter (Signed)
Provided lab result notes of  Dr.  Lorayne Marek 04/30/19.  Patient voiced understanding.

## 2019-05-21 ENCOUNTER — Encounter: Payer: Self-pay | Admitting: Nurse Practitioner

## 2019-05-30 ENCOUNTER — Encounter: Payer: Self-pay | Admitting: Nurse Practitioner

## 2019-06-05 ENCOUNTER — Other Ambulatory Visit: Payer: Self-pay

## 2019-06-06 ENCOUNTER — Other Ambulatory Visit (INDEPENDENT_AMBULATORY_CARE_PROVIDER_SITE_OTHER): Payer: Medicare HMO

## 2019-06-06 DIAGNOSIS — B351 Tinea unguium: Secondary | ICD-10-CM

## 2019-06-06 NOTE — Addendum Note (Signed)
Addended by: Lynnea Ferrier on: 06/06/2019 04:05 PM   Modules accepted: Orders

## 2019-06-07 LAB — HEPATIC FUNCTION PANEL
ALT: 8 U/L (ref 0–35)
AST: 15 U/L (ref 0–37)
Albumin: 4.7 g/dL (ref 3.5–5.2)
Alkaline Phosphatase: 78 U/L (ref 39–117)
Bilirubin, Direct: 0 mg/dL (ref 0.0–0.3)
Total Bilirubin: 0.2 mg/dL (ref 0.2–1.2)
Total Protein: 7.1 g/dL (ref 6.0–8.3)

## 2019-06-08 MED ORDER — TERBINAFINE HCL 250 MG PO TABS: 250.0000 mg | ORAL_TABLET | Freq: Every day | ORAL | 0 refills | Status: DC

## 2019-06-08 NOTE — Addendum Note (Signed)
Addended by: Leana Gamer on: 06/08/2019 08:47 AM   Modules accepted: Orders

## 2019-07-23 ENCOUNTER — Other Ambulatory Visit: Payer: Self-pay | Admitting: Nurse Practitioner

## 2019-07-23 DIAGNOSIS — E2839 Other primary ovarian failure: Secondary | ICD-10-CM

## 2019-07-23 DIAGNOSIS — Z13820 Encounter for screening for osteoporosis: Secondary | ICD-10-CM

## 2019-07-25 ENCOUNTER — Ambulatory Visit
Admission: RE | Admit: 2019-07-25 | Discharge: 2019-07-25 | Disposition: A | Discharge status: Home / Self Care | Payer: Medicare HMO | Source: Ambulatory Visit | Attending: Nurse Practitioner | Admitting: Nurse Practitioner

## 2019-07-25 ENCOUNTER — Other Ambulatory Visit: Payer: Self-pay

## 2019-07-25 DIAGNOSIS — M8589 Other specified disorders of bone density and structure, multiple sites: Secondary | ICD-10-CM | POA: Diagnosis not present

## 2019-07-25 DIAGNOSIS — Z780 Asymptomatic menopausal state: Secondary | ICD-10-CM | POA: Diagnosis not present

## 2019-07-25 DIAGNOSIS — Z13820 Encounter for screening for osteoporosis: Secondary | ICD-10-CM

## 2019-07-25 DIAGNOSIS — E2839 Other primary ovarian failure: Secondary | ICD-10-CM

## 2019-07-25 DIAGNOSIS — Z1231 Encounter for screening mammogram for malignant neoplasm of breast: Secondary | ICD-10-CM | POA: Diagnosis not present

## 2019-08-16 ENCOUNTER — Encounter: Payer: Self-pay | Admitting: Nurse Practitioner

## 2019-08-16 DIAGNOSIS — L603 Nail dystrophy: Secondary | ICD-10-CM

## 2019-10-15 ENCOUNTER — Ambulatory Visit: Payer: Medicare HMO | Admitting: Dermatology

## 2019-10-15 ENCOUNTER — Encounter: Payer: Self-pay | Admitting: Dermatology

## 2019-10-15 ENCOUNTER — Other Ambulatory Visit: Payer: Self-pay

## 2019-10-15 DIAGNOSIS — B351 Tinea unguium: Secondary | ICD-10-CM

## 2019-10-15 DIAGNOSIS — D229 Melanocytic nevi, unspecified: Secondary | ICD-10-CM

## 2019-10-15 DIAGNOSIS — D225 Melanocytic nevi of trunk: Secondary | ICD-10-CM | POA: Diagnosis not present

## 2019-10-15 LAB — POCT SKIN KOH: Skin KOH, POC: NEGATIVE

## 2019-10-15 MED ORDER — CLINDAMYCIN PHOSPHATE 1 % EX SOLN: Freq: Every day | CUTANEOUS | 2 refills | Status: DC

## 2019-10-17 ENCOUNTER — Encounter: Payer: Self-pay | Admitting: Dermatology

## 2019-10-17 NOTE — Progress Notes (Signed)
   Follow-Up Visit   Subjective  Sarah Reid is a 64 y.o. female who presents for the following: Nail Problem (Both thumbnails are discolored.  Been ongoing for a while.  PCP prescribed her 2 rounds of Terbinafine and there was no improvement. Denies trauma to the nails.).  Discoloration Location: The nails Duration: Past year Quality: Constant Associated Signs/Symptoms: Modifying Factors: 2 rounds of oral terbinafine without improvement Severity:  Timing: Context: Bothersome to patient  The following portions of the chart were reviewed this encounter and updated as appropriate:     Objective  Well appearing patient in no apparent distress; mood and affect are within normal limits.  A focused examination was performed including hands, feet, nails, back, face. Relevant physical exam findings are noted in the Assessment and Plan.   Assessment & Plan  Nail fungus (2) Left Thumb Nail Plate; Right Thumb Nail Plate  Other Related Procedures Culture, fungus without smear POCT Skin KOH

## 2019-10-22 ENCOUNTER — Other Ambulatory Visit: Payer: Self-pay

## 2019-10-22 ENCOUNTER — Encounter: Payer: Self-pay | Admitting: Nurse Practitioner

## 2019-10-22 ENCOUNTER — Ambulatory Visit (INDEPENDENT_AMBULATORY_CARE_PROVIDER_SITE_OTHER): Payer: Medicare HMO | Admitting: Nurse Practitioner

## 2019-10-22 VITALS — BP 112/80 | HR 67 | Temp 96.7°F | Ht 63.0 in | Wt 129.2 lb

## 2019-10-22 DIAGNOSIS — E782 Mixed hyperlipidemia: Secondary | ICD-10-CM

## 2019-10-22 DIAGNOSIS — G43019 Migraine without aura, intractable, without status migrainosus: Secondary | ICD-10-CM | POA: Diagnosis not present

## 2019-10-22 DIAGNOSIS — R69 Illness, unspecified: Secondary | ICD-10-CM | POA: Diagnosis not present

## 2019-10-22 DIAGNOSIS — F325 Major depressive disorder, single episode, in full remission: Secondary | ICD-10-CM

## 2019-10-22 MED ORDER — FLUOXETINE HCL 20 MG PO CAPS: 20.0000 mg | ORAL_CAPSULE | Freq: Every day | ORAL | 3 refills | Status: DC

## 2019-10-22 MED ORDER — BUTALBITAL-APAP-CAFFEINE 50-325-40 MG PO TABS: 1.0000 | ORAL_TABLET | Freq: Four times a day (QID) | ORAL | 0 refills | Status: DC | PRN

## 2019-10-22 NOTE — Progress Notes (Signed)
Subjective:  Patient ID: Sarah Reid, female    DOB: 04/02/1956  Age: 63 y.o. MRN: [id number]  CC: Follow-up (6 month-anxiety and depression-pt states medication makes her feel better//No covid shot)  HPI Depression: Stable with fluoxetine. Depression screen [facility] 2/9 10/22/2019 10/22/2019 04/30/2019  Decreased Interest 0 0 0  Down, Depressed, Hopeless 0 0 0  PHQ - 2 Score 0 0 0  Altered sleeping 0 - 1  Tired, decreased energy 1 - 1  Change in appetite 0 - 0  Feeling bad or failure about yourself  0 - 0  Trouble concentrating 0 - 0  Moving slowly or fidgety/restless 0 - 0  Suicidal thoughts 0 - 0  PHQ-9 Score 1 - 2  Difficult doing work/chores Not difficult at all - Not difficult at all   GAD 7 : Generalized Anxiety Score 10/22/2019 04/30/2019  Nervous, Anxious, on Edge 0 1  Control/stop worrying 0 0  Worry too much - different things 0 0  Trouble relaxing 0 1  Restless 0 0  Easily annoyed or irritable 0 1  Afraid - awful might happen 0 0  Total GAD 7 Score 0 3  Anxiety Difficulty - Not difficult at all   Osteopenia: Last dexa scan 07/2019 Reports she is not consistent with use of calcium and vitamin D supplement.  No daily exercise regimen.  Hyperlipidemia: familial hypercholesteremia. ASCVD risk of 6.3% Declined used of any medication. No tobacco use or hx of CAD/PAD or HTN or DM.  Reviewed past Medical, Social and Family history today.  Outpatient Medications Prior to Visit  Medication Sig Dispense Refill  . DiphenhydrAMINE HCl (BENADRYL ALLERGY PO) Take 2 tablets by mouth daily.    . fluticasone (FLONASE) 50 MCG/ACT nasal spray Place 2 sprays into the nose daily. 16 g 6  . butalbital-acetaminophen-caffeine (FIORICET) 50-325-40 MG tablet Take 1 tablet by mouth every 6 (six) hours as needed for headache. 12 tablet 0  . FLUoxetine (PROZAC) 20 MG capsule Take 1 capsule (20 mg total) by mouth daily. 90 capsule 3  . calcium carbonate (TUMS - DOSED IN MG ELEMENTAL CALCIUM)  500 MG chewable tablet Chew 1 tablet by mouth daily.      . clindamycin (CLEOCIN T) 1 % external solution Apply topically daily. (Patient not taking: Reported on 10/22/2019) 30 mL 2   No facility-administered medications prior to visit.    ROS See HPI  Objective:  BP 112/80   Pulse 67   Temp (!) 96.7 F (35.9 C) (Tympanic)   Ht 5\' 3"  (1.6 m)   Wt 129 lb 3.2 oz (58.6 kg)   SpO2 99%   BMI 22.89 kg/m   BP Readings from Last 3 Encounters:  10/22/19 112/80  04/30/19 114/78  02/02/18 108/68    Wt Readings from Last 3 Encounters:  10/22/19 129 lb 3.2 oz (58.6 kg)  04/30/19 129 lb 3.2 oz (58.6 kg)  02/02/18 133 lb (60.3 kg)    Physical Exam Vitals reviewed.  Cardiovascular:     Rate and Rhythm: Normal rate and regular rhythm.     Pulses: Normal pulses.     Heart sounds: Normal heart sounds.  Pulmonary:     Effort: Pulmonary effort is normal.     Breath sounds: Normal breath sounds.  Musculoskeletal:     Right lower leg: No edema.     Left lower leg: No edema.  Neurological:     Mental Status: She is alert and oriented to person, place, and time.  Psychiatric:        Mood and Affect: Mood normal.        Behavior: Behavior normal.        Thought Content: Thought content normal.     Lab Results  Component Value Date   WBC 4.5 04/30/2019   HGB 11.6 (L) 04/30/2019   HCT 34.4 (L) 04/30/2019   PLT 227.0 04/30/2019   GLUCOSE 93 04/30/2019   CHOL 339 (H) 04/30/2019   TRIG 129.0 04/30/2019   HDL 53.40 04/30/2019   LDLDIRECT 273.7 11/07/2008   LDLCALC 260 (H) 04/30/2019   ALT 8 06/06/2019   AST 15 06/06/2019   NA 138 04/30/2019   K 4.2 04/30/2019   CL 102 04/30/2019   CREATININE 0.71 04/30/2019   BUN 14 04/30/2019   CO2 29 04/30/2019   TSH 1.60 04/30/2019    DG Bone Density  Result Date: 07/25/2019 EXAM: DUAL X-RAY ABSORPTIOMETRY (DXA) FOR BONE MINERAL DENSITY IMPRESSION: Referring Physician:  Winton Your patient completed a BMD test using Lunar  IDXA DXA system ( analysis version: 16 ) manufactured by [employer]. Technologist: AW PATIENT: Name: Deijah, Blonder Patient ID: [id number] Birth Date: Jan 01, 1956 Height: 63.0 in. Sex: Female Measured: 07/25/2019 Weight: 135.8 lbs. Indications: Bilateral Ovariectomy (65.51), Caucasian, Estrogen Deficient, Family Hist. (Parent hip fracture), Hysterectomy, Postmenopausal, Prozac Fractures: None Treatments: Vitamin D (E933.5) ASSESSMENT: The BMD measured at Femur Neck Right is 0.781 g/cm2 with a T-score of -1.9. This patient is considered OSTEOPENIC according to [facility]) criteria. The scan quality is good. L-3 was excluded due to degenerative changes. Site Region Measured Date Measured Age YA T-score BMD Significant CHANGE DualFemur Neck Right 07/25/2019    65.8         -1.9    0.781 g/cm2 AP Spine  L1-L4 (L3) 07/25/2019    65.8         -1.2    1.026 g/cm2 DualFemur Total Mean 07/25/2019    65.8         -1.2    0.855 g/cm2 World Health Organization [facility]) criteria for post-menopausal, Caucasian Women: Normal       T-score at or above -1 SD Osteopenia   T-score between -1 and -2.5 SD Osteoporosis T-score at or below -2.5 SD RECOMMENDATION: 1. All patients should optimize calcium and vitamin D intake. 2. Consider FDA approved medical therapies in postmenopausal women and men aged 17 years and older, based on the following: a. A hip or vertebral (clinical or morphometric) fracture b. T- score < or = -2.5 at the femoral neck or spine after appropriate evaluation to exclude secondary causes c. Low bone mass (T-score between -1.0 and -2.5 at the femoral neck or spine) and a 10 year probability of a hip fracture > or = 3% or a 10 year probability of a major osteoporosis-related fracture > or = 20% based on the [place]-adapted WHO algorithm d. Clinician judgment and/or patient preferences may indicate treatment for people with 10-year fracture probabilities above or below these levels FOLLOW-UP: Patients with  diagnosis of osteoporosis or at high risk for fracture should have regular bone mineral density tests. For patients eligible for Medicare, routine testing is allowed once every 2 years. The testing frequency can be increased to one year for patients who have rapidly progressing disease, those who are receiving or discontinuing medical therapy to restore bone mass, or have additional risk factors. I have reviewed this report and agree with the above findings. Mark A. Thornton Papas,  M.D. [facility] FRAX* 10-year Probability of Fracture Based on femoral neck BMD: DualFemur (Right) Major Osteoporotic Fracture: 18.6% Hip Fracture:                1.7% Population:                  [place] (Caucasian) Risk Factors:                Family Hist. (Parent hip fracture) *FRAX is a [facility] [employer] for Metabolic Bone Disease, a [place] (WHO) [facility]. ASSESSMENT: The probability of a major osteoporotic fracture is 18.6 % within the next ten years. The probability of a hip fracture is 1.7 % within the next ten years. I have reviewed this report and agree with the above findings. Mark A. Thornton Papas, M.D. [facility]iology Electronically Signed   By: Lavonia Dana M.D.   On: 07/25/2019 16:00   MM 3D SCREEN BREAST BILATERAL  Result Date: 07/25/2019 CLINICAL DATA:  Screening. EXAM: DIGITAL SCREENING BILATERAL MAMMOGRAM WITH TOMO AND CAD COMPARISON:  Previous exam(s). ACR Breast Density Category c: The breast tissue is heterogeneously dense, which may obscure small masses. FINDINGS: There are no findings suspicious for malignancy. Images were processed with CAD. IMPRESSION: No mammographic evidence of malignancy. A result letter of this screening mammogram will be mailed directly to the patient. RECOMMENDATION: Screening mammogram in one year. (Code:SM-B-01Y) BI-RADS CATEGORY  1: Negative. Electronically Signed   By: Curlene Dolphin M.D.   On: 07/25/2019 14:25     Assessment & Plan:  This visit occurred during the SARS-CoV-2 public health emergency.  Safety protocols were in place, including screening questions prior to the visit, additional usage of staff PPE, and extensive cleaning of exam room while observing appropriate contact time as indicated for disinfecting solutions.   Brettney was seen today for follow-up.  Diagnoses and all orders for this visit:  Major depressive disorder with single episode, in full remission ([place]) -     FLUoxetine (PROZAC) 20 MG capsule; Take 1 capsule (20 mg total) by mouth daily.  Mixed hyperlipidemia  Intractable migraine without aura and without status migrainosus -     butalbital-acetaminophen-caffeine (FIORICET) 50-325-40 MG tablet; Take 1 tablet by mouth every 6 (six) hours as needed for headache.   I have discontinued Titianna B. Kantner's clindamycin. I am also having her maintain her calcium carbonate, DiphenhydrAMINE HCl (BENADRYL ALLERGY PO), fluticasone, FLUoxetine, and butalbital-acetaminophen-caffeine.  Meds ordered this encounter  Medications  . FLUoxetine (PROZAC) 20 MG capsule    Sig: Take 1 capsule (20 mg total) by mouth daily.    Dispense:  90 capsule    Refill:  3    Order Specific Question:   Supervising Provider    Answer:   Ronnald Nian [id number]  . butalbital-acetaminophen-caffeine (FIORICET) 50-325-40 MG tablet    Sig: Take 1 tablet by mouth every 6 (six) hours as needed for headache.    Dispense:  12 tablet    Refill:  0    Order Specific Question:   Supervising Provider    Answer:   Ronnald Nian [id number]    Problem List Items Addressed This Visit      Cardiovascular and Mediastinum   Intractable migraine without aura and without status migrainosus    Stable. Reports migraine headache every 19months Resolves with use of fioricet 1-2tabs      Relevant Medications   FLUoxetine (PROZAC) 20 MG capsule  butalbital-acetaminophen-caffeine (FIORICET) 50-325-40 MG tablet      Other   Hyperlipidemia    familial hypercholesteremia. ASCVD risk of 6.3% Declined used of any medication. No tobacco use or hx of CAD/PAD or HTN or DM. Repeat lipid panel annually. Encourage DASH diet and daily exercise. Lipid Panel     Component Value Date/Time   CHOL 339 (H) 04/30/2019 1009   TRIG 129.0 04/30/2019 1009   TRIG 70 04/07/2006 1209   HDL 53.40 04/30/2019 1009   CHOLHDL 6 04/30/2019 1009   VLDL 25.8 04/30/2019 1009   LDLCALC 260 (H) 04/30/2019 1009   LDLDIRECT 273.7 11/07/2008 1001        Major depressive disorder with single episode, in full remission ([place]) - Primary    Stable mood with fluoxetine F/up in 25months      Relevant Medications   FLUoxetine (PROZAC) 20 MG capsule       Follow-up: Return in about 6 months (around 04/23/2020) for depression and , hyperlipidemia (fasting).  Wilfred Lacy, NP

## 2019-10-22 NOTE — Assessment & Plan Note (Signed)
Stable mood with fluoxetine F/up in 50months

## 2019-10-22 NOTE — Assessment & Plan Note (Addendum)
familial hypercholesteremia. ASCVD risk of 6.3% Declined used of any medication. No tobacco use or hx of CAD/PAD or HTN or DM. Repeat lipid panel annually. Encourage DASH diet and daily exercise. Lipid Panel     Component Value Date/Time   CHOL 339 (H) 04/30/2019 1009   TRIG 129.0 04/30/2019 1009   TRIG 70 04/07/2006 1209   HDL 53.40 04/30/2019 1009   CHOLHDL 6 04/30/2019 1009   VLDL 25.8 04/30/2019 1009   LDLCALC 260 (H) 04/30/2019 1009   LDLDIRECT 273.7 11/07/2008 1001

## 2019-10-22 NOTE — Patient Instructions (Addendum)
Maintain current medications Ok to take ibuprofen and fioricet 2-4hrs apart with food F/up in 69months

## 2019-10-22 NOTE — Assessment & Plan Note (Signed)
Stable. Reports migraine headache every 88months Resolves with use of fioricet 1-2tabs

## 2019-10-23 NOTE — Progress Notes (Signed)
This visit is being conducted via phone call  - after an attmept to do on video chat - due to the COVID-19 pandemic. This patient has given me verbal consent via phone to conduct this visit, patient states they are participating from their home address. Some vital signs may be absent or patient reported.   Patient identification: identified by name, DOB, and current address.     Subjective:   Sarah Reid is a 64 y.o. female who presents for an Initial Medicare Annual Wellness Visit.  The Patient was informed that the wellness visit is to identify future health risk and educate and initiate measures that can reduce risk for increased disease through the lifespan.   Describes health as fair, good or great? Good.   Pt still works full time. Monitors 401k programs.  Review of Systems    Cardiac Risk Factors include: advanced age (>62men, >80 women);hypertension Home Safety/Smoke Alarms: Feels safe in home. Smoke alarms in place.  Lives alone with 4 little dogs. Siblings live close.  Female:        Mammo- 07/25/19      Dexa scan- 07/25/19      CCS- declines     Objective:     Current Medications (verified) Outpatient Encounter Medications as of 10/24/2019  Medication Sig  . butalbital-acetaminophen-caffeine (FIORICET) 50-325-40 MG tablet Take 1 tablet by mouth every 6 (six) hours as needed for headache.  . calcium carbonate (TUMS - DOSED IN MG ELEMENTAL CALCIUM) 500 MG chewable tablet Chew 1 tablet by mouth daily.    . DiphenhydrAMINE HCl (BENADRYL ALLERGY PO) Take 2 tablets by mouth daily.  Marland Kitchen FLUoxetine (PROZAC) 20 MG capsule Take 1 capsule (20 mg total) by mouth daily.  . fluticasone (FLONASE) 50 MCG/ACT nasal spray Place 2 sprays into the nose daily.   No facility-administered encounter medications on file as of 10/24/2019.    Allergies (verified) Patient has no known allergies.   History: Past Medical History:  Diagnosis Date  . ALLERGIC RHINITIS 02/18/2007  . ASYMPTOMATIC  POSTMENOPAUSAL STATUS 11/07/2008  . BRADYCARDIA 11/07/2008  . DEPRESSION 11/07/2008  . FIBROIDS, UTERUS 11/07/2008  . GOITER, MULTINODULAR 11/07/2008  . Headache(784.0) 11/07/2008  . HYPERLIPIDEMIA 02/18/2007  . LEUKOPENIA, CHRONIC 11/07/2008   Past Surgical History:  Procedure Laterality Date  . ABDOMINAL HYSTERECTOMY  2001   Family History  Problem Relation Age of Onset  . Dementia Mother 58  . Osteoporosis Mother   . CVA Mother   . Dementia Father 93  . Anxiety disorder Father   . Heart disease Brother   . Valvular heart disease Brother   . Arrhythmia Brother        pacemaker inserted  . GER disease Brother   . Heart disease Maternal Uncle   . Heart disease Paternal Uncle   . Cancer Neg Hx   . Thyroid disease Neg Hx    Social History   Socioeconomic History  . Marital status: Single    Spouse name: Not on file  . Number of children: 1  . Years of education: Not on file  . Highest education level: Not on file  Occupational History    Comment: [employer]  Tobacco Use  . Smoking status: Never Smoker  . Smokeless tobacco: Never Used  Substance and Sexual Activity  . Alcohol use: Not Currently  . Drug use: Never  . Sexual activity: Not Currently    Birth control/protection: Surgical, Post-menopausal  Other Topics Concern  . Not on file  Social History Narrative  . Not on file   Social Determinants of Health   Financial Resource Strain: Low Risk   . Difficulty of Paying Living Expenses: Not hard at all  Food Insecurity: No Food Insecurity  . Worried About Charity fundraiser in the Last Year: Never true  . Ran Out of Food in the Last Year: Never true  Transportation Needs: No Transportation Needs  . Lack of Transportation (Medical): No  . Lack of Transportation (Non-Medical): No  Physical Activity:   . Days of Exercise per Week:   . Minutes of Exercise per Session:   Stress:   . Feeling of Stress :   Social Connections:   . Frequency of  Communication with Friends and Family:   . Frequency of Social Gatherings with Friends and Family:   . Attends Religious Services:   . Active Member of Clubs or Organizations:   . Attends Archivist Meetings:   Marland Kitchen Marital Status:     Tobacco Counseling Counseling given: Not Answered   Clinical Intake: Pain : No/denies pain    Activities of Daily Living In your present state of health, do you have any difficulty performing the following activities: 10/24/2019  Hearing? N  Vision? N  Difficulty concentrating or making decisions? N  Walking or climbing stairs? N  Dressing or bathing? N  Doing errands, shopping? N  Preparing Food and eating ? N  Using the Toilet? N  In the past six months, have you accidently leaked urine? N  Do you have problems with loss of bowel control? N  Managing your Medications? N  Managing your Finances? N  Housekeeping or managing your Housekeeping? N  Some recent data might be hidden     Immunizations and Health Maintenance Immunization History  Administered Date(s) Administered  . Fluad Quad(high Dose 65+) 04/30/2019  . Influenza Whole 05/28/2011  . Influenza,inj,Quad PF,6+ Mos 04/19/2013  . Pneumococcal Conjugate-13 04/30/2019  . Td 01/20/2003   Health Maintenance Due  Topic Date Due  . COVID-19 Vaccine (1) Never done    Patient Care Team: Nche, Charlene Brooke, NP as PCP - General (Internal Medicine) Leo Grosser, Seymour Bars, MD (Inactive) as Consulting Physician (Obstetrics and Gynecology) [name], MD as Consulting Physician (Dermatology)  Indicate any recent Medical Services you may have received from other than [facility] providers in the past year (date may be approximate).     Assessment:   This is a routine wellness examination for Sarah Reid. Physical assessment deferred to PCP.  Hearing/Vision screen Unable to assess. This visit is enabled though telemedicine due to Covid 19.   Dietary issues and exercise activities  discussed: Current Exercise Habits: Home exercise routine, Type of exercise: walking, Time (Minutes): 15, Frequency (Times/Week): 3, Weekly Exercise (Minutes/Week): 45, Intensity: Mild, Exercise limited by: None identified  Diet (meal preparation, eat out, water intake, caffeinated beverages, dairy products, fruits and vegetables): well balanced   Goals    . maintain healthy active lifestyle.      Depression Screen PHQ 2/9 Scores 10/22/2019 10/22/2019 04/30/2019 04/30/2019  PHQ - 2 Score 0 0 0 0  PHQ- 9 Score 1 - 2 -    Fall Risk Fall Risk  10/22/2019 04/30/2019  Falls in the past year? 0 0  Number falls in past yr: 0 -  Injury with Fall? 0 -    Cognitive Function: Ad8 score reviewed for issues:  Issues making decisions:no  Less interest in hobbies / activities:no  Repeats questions, stories (family  complaining):no  Trouble using ordinary gadgets (microwave, computer, phone):no  Forgets the month or year: no  Mismanaging finances: no  Remembering appts:no  Daily problems with thinking and/or memory:no Ad8 score is=0         Screening Tests Health Maintenance  Topic Date Due  . COVID-19 Vaccine (1) Never done  . COLONOSCOPY  04/29/2020 (Originally 09/20/2003)  . TETANUS/TDAP  04/29/2020 (Originally 01/19/2013)  . Hepatitis C Screening  04/29/2020 (Originally [birth year])  . INFLUENZA VACCINE  01/20/2020  . PNA vac Low Risk Adult (2 of 2 - PPSV23) 04/29/2020  . MAMMOGRAM  07/24/2021  . DEXA SCAN  Completed     Plan:    Please schedule your next medicare wellness visit with me in 1 yr.  Continue to eat heart healthy diet (full of fruits, vegetables, whole grains, lean protein, water--limit salt, fat, and sugar intake) and increase physical activity as tolerated.  Continue doing brain stimulating activities (puzzles, reading, adult coloring books, staying active) to keep memory sharp.     I have personally reviewed and noted the following in the patient's chart:    . Medical and social history . Use of alcohol, tobacco or illicit drugs  . Current medications and supplements . Functional ability and status . Nutritional status . Physical activity . Advanced directives . List of other physicians . Hospitalizations, surgeries, and ER visits in previous 12 months . Vitals . Screenings to include cognitive, depression, and falls . Referrals and appointments  In addition, I have reviewed and discussed with patient certain preventive protocols, quality metrics, and best practice recommendations. A written personalized care plan for preventive services as well as general preventive health recommendations were provided to patient.     Shela Nevin, [place]   10/24/2019

## 2019-10-24 ENCOUNTER — Encounter: Payer: Self-pay | Admitting: *Deleted

## 2019-10-24 ENCOUNTER — Ambulatory Visit (INDEPENDENT_AMBULATORY_CARE_PROVIDER_SITE_OTHER): Payer: Medicare HMO | Admitting: *Deleted

## 2019-10-24 ENCOUNTER — Telehealth: Payer: Self-pay | Admitting: *Deleted

## 2019-10-24 DIAGNOSIS — Z0000 Encounter for general adult medical examination without abnormal findings: Secondary | ICD-10-CM

## 2019-10-24 LAB — CULTURE, FUNGUS WITHOUT SMEAR
MICRO NUMBER:: 10410598
SPECIMEN QUALITY:: ADEQUATE

## 2019-10-24 NOTE — Telephone Encounter (Signed)
-----   Message from [name], MD sent at 10/24/2019  2:37 PM EDT ----- Inform pt culture did show a type of yeast but it may not be the cause of her nail discoloration. Dr. Darene Lamer wants her to continue the topical clindamycin for at least two months, if no improvement I'll pharmacist make a custom topical to cover the yeast.

## 2019-10-24 NOTE — Patient Instructions (Signed)
Please schedule your next medicare wellness visit with me in 1 yr.  Continue to eat heart healthy diet (full of fruits, vegetables, whole grains, lean protein, water--limit salt, fat, and sugar intake) and increase physical activity as tolerated.  Continue doing brain stimulating activities (puzzles, reading, adult coloring books, staying active) to keep memory sharp.    Sarah Reid , Thank you for taking time to come for your Medicare Wellness Visit. I appreciate your ongoing commitment to your health goals. Please review the following plan we discussed and let me know if I can assist you in the future.   These are the goals we discussed: Goals    . maintain healthy active lifestyle.       This is a list of the screening recommended for you and due dates:  Health Maintenance  Topic Date Due  . COVID-19 Vaccine (1) Never done  . Colon Cancer Screening  04/29/2020*  . Tetanus Vaccine  04/29/2020*  .  Hepatitis C: One time screening is recommended by Center for Disease Control  (CDC) for  adults born from [birth year] through 1967.   04/29/2020*  . Flu Shot  01/20/2020  . Pneumonia vaccines (2 of 2 - PPSV23) 04/29/2020  . Mammogram  07/24/2021  . DEXA scan (bone density measurement)  Completed  *Topic was postponed. The date shown is not the original due date.    Preventive Care 31 Years and Older, Female Preventive care refers to lifestyle choices and visits with your health care provider that can promote health and wellness. This includes:  A yearly physical exam. This is also called an annual well check.  Regular dental and eye exams.  Immunizations.  Screening for certain conditions.  Healthy lifestyle choices, such as diet and exercise. What can I expect for my preventive care visit? Physical exam Your health care provider will check:  Height and weight. These may be used to calculate body mass index (BMI), which is a measurement that tells if you are at a healthy  weight.  Heart rate and blood pressure.  Your skin for abnormal spots. Counseling Your health care provider may ask you questions about:  Alcohol, tobacco, and drug use.  Emotional well-being.  Home and relationship well-being.  Sexual activity.  Eating habits.  History of falls.  Memory and ability to understand (cognition).  Work and work Statistician.  Pregnancy and menstrual history. What immunizations do I need?  Influenza (flu) vaccine  This is recommended every year. Tetanus, diphtheria, and pertussis (Tdap) vaccine  You may need a Td booster every 10 years. Varicella (chickenpox) vaccine  You may need this vaccine if you have not already been vaccinated. Zoster (shingles) vaccine  You may need this after age 7. Pneumococcal conjugate (PCV13) vaccine  One dose is recommended after age 44. Pneumococcal polysaccharide (PPSV23) vaccine  One dose is recommended after age 78. Measles, mumps, and rubella (MMR) vaccine  You may need at least one dose of MMR if you were born in 1957 or later. You may also need a second dose. Meningococcal conjugate (MenACWY) vaccine  You may need this if you have certain conditions. Hepatitis A vaccine  You may need this if you have certain conditions or if you travel or work in places where you may be exposed to hepatitis A. Hepatitis B vaccine  You may need this if you have certain conditions or if you travel or work in places where you may be exposed to hepatitis B. Haemophilus influenzae type b (Hib)  vaccine  You may need this if you have certain conditions. You may receive vaccines as individual doses or as more than one vaccine together in one shot (combination vaccines). Talk with your health care provider about the risks and benefits of combination vaccines. What tests do I need? Blood tests  Lipid and cholesterol levels. These may be checked every 5 years, or more frequently depending on your overall  health.  Hepatitis C test.  Hepatitis B test. Screening  Lung cancer screening. You may have this screening every year starting at age 68 if you have a 30-pack-year history of smoking and currently smoke or have quit within the past 15 years.  Colorectal cancer screening. All adults should have this screening starting at age 37 and continuing until age 26. Your health care provider may recommend screening at age 43 if you are at increased risk. You will have tests every 1-10 years, depending on your results and the type of screening test.  Diabetes screening. This is done by checking your blood sugar (glucose) after you have not eaten for a while (fasting). You may have this done every 1-3 years.  Mammogram. This may be done every 1-2 years. Talk with your health care provider about how often you should have regular mammograms.  BRCA-related cancer screening. This may be done if you have a family history of breast, ovarian, tubal, or peritoneal cancers. Other tests  Sexually transmitted disease (STD) testing.  Bone density scan. This is done to screen for osteoporosis. You may have this done starting at age 89. Follow these instructions at home: Eating and drinking  Eat a diet that includes fresh fruits and vegetables, whole grains, lean protein, and low-fat dairy products. Limit your intake of foods with high amounts of sugar, saturated fats, and salt.  Take vitamin and mineral supplements as recommended by your health care provider.  Do not drink alcohol if your health care provider tells you not to drink.  If you drink alcohol: ? Limit how much you have to 0-1 drink a day. ? Be aware of how much alcohol is in your drink. In the [place] one drink equals one 12 oz bottle of beer (355 mL), one 5 oz glass of wine (148 mL), or one 1 oz glass of hard liquor (44 mL). Lifestyle  Take daily care of your teeth and gums.  Stay active. Exercise for at least 30 minutes on 5 or more days  each week.  Do not use any products that contain nicotine or tobacco, such as cigarettes, e-cigarettes, and chewing tobacco. If you need help quitting, ask your health care provider.  If you are sexually active, practice safe sex. Use a condom or other form of protection in order to prevent STIs (sexually transmitted infections).  Talk with your health care provider about taking a low-dose aspirin or statin. What's next?  Go to your health care provider once a year for a well check visit.  Ask your health care provider how often you should have your eyes and teeth checked.  Stay up to date on all vaccines. This information is not intended to replace advice given to you by your health care provider. Make sure you discuss any questions you have with your health care provider. Document Revised: 06/01/2018 Document Reviewed: 06/01/2018 Elsevier Patient Education  2020 [employer].

## 2020-01-10 ENCOUNTER — Other Ambulatory Visit: Payer: Self-pay | Admitting: Nurse Practitioner

## 2020-01-10 DIAGNOSIS — G43019 Migraine without aura, intractable, without status migrainosus: Secondary | ICD-10-CM

## 2020-01-10 NOTE — Telephone Encounter (Signed)
Last OV 10/22/2019 Last fill 10/22/19  #12/0

## 2020-03-02 IMAGING — MG DIGITAL SCREENING BILAT W/ TOMO W/ CAD
8 series · 9 of 24 positions shown · non-contrast
Comparison: Previous exam(s).

CLINICAL DATA: Screening.

EXAM:
DIGITAL SCREENING BILATERAL MAMMOGRAM WITH TOMO AND CAD

[R CC synth-2D]
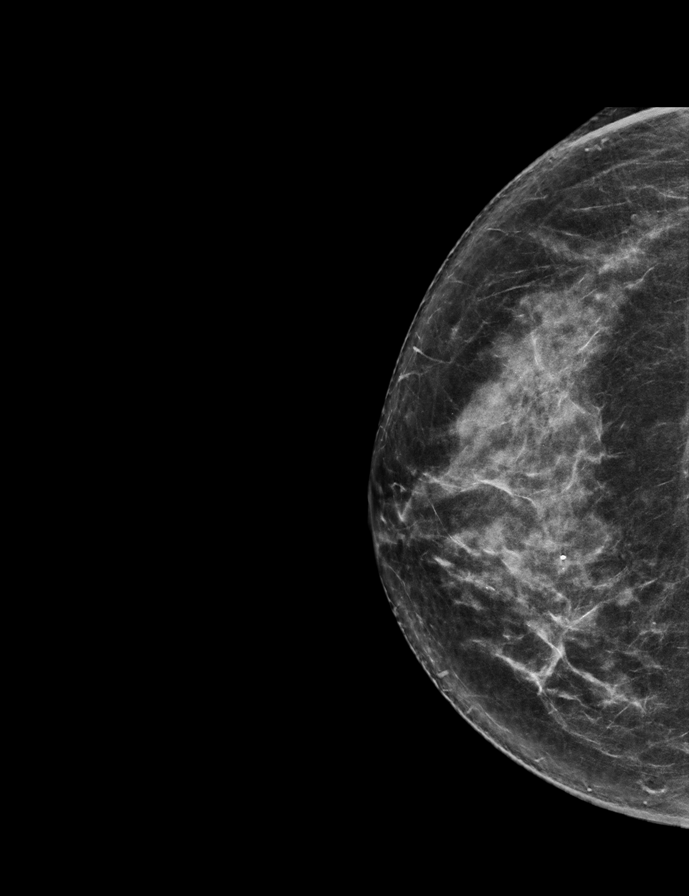

[R MLO synth-2D]
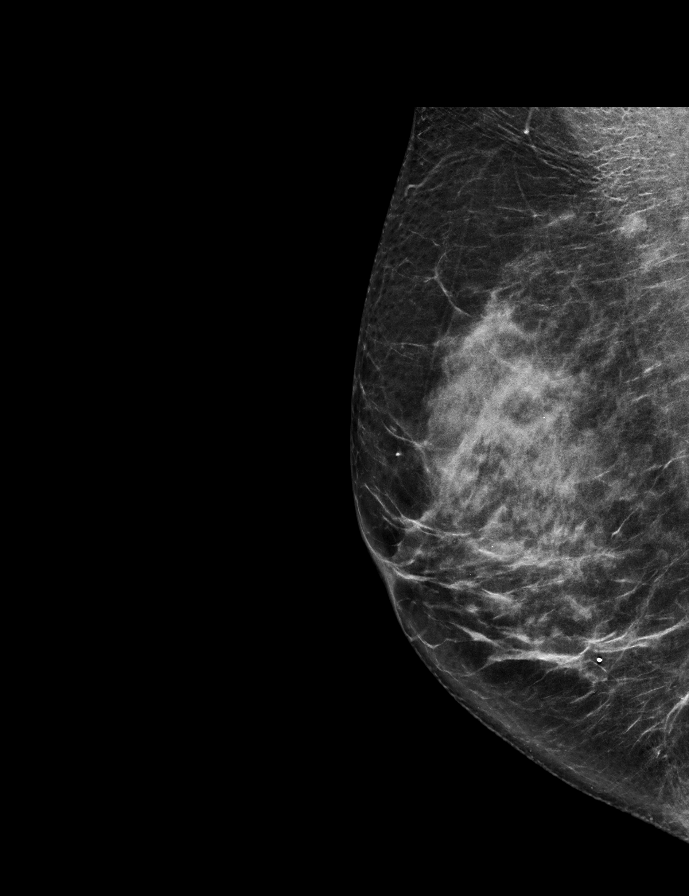

[L CC synth-2D]
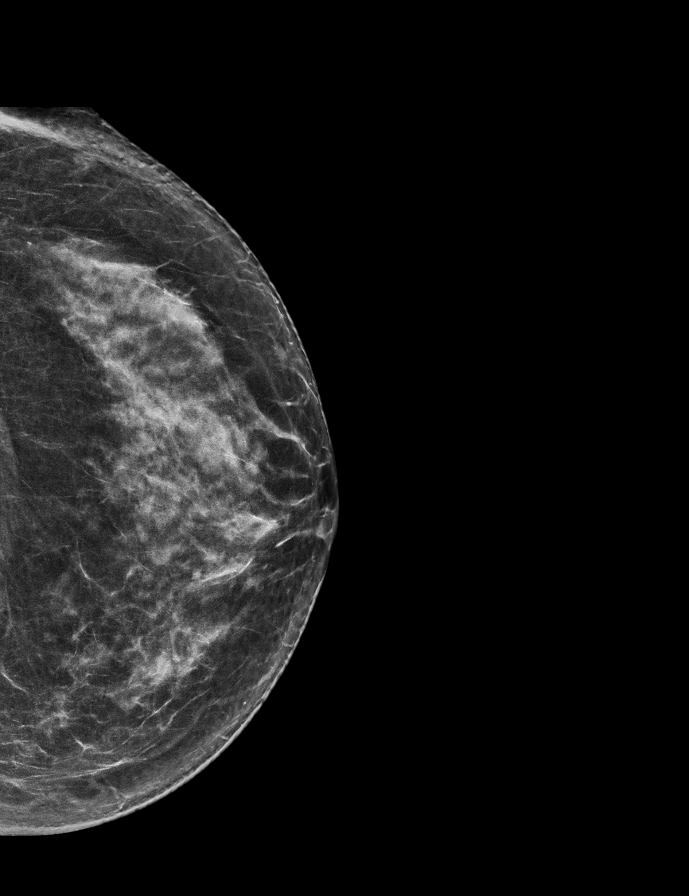

[L MLO synth-2D]
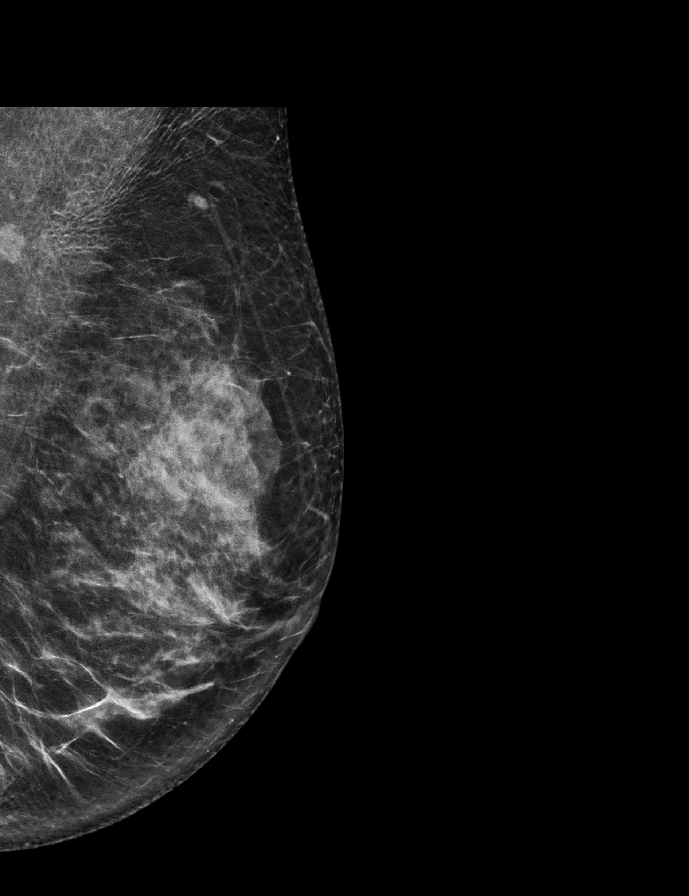

[L MLO tomo · 2 of 69 frames shown]
[frame 23/69]
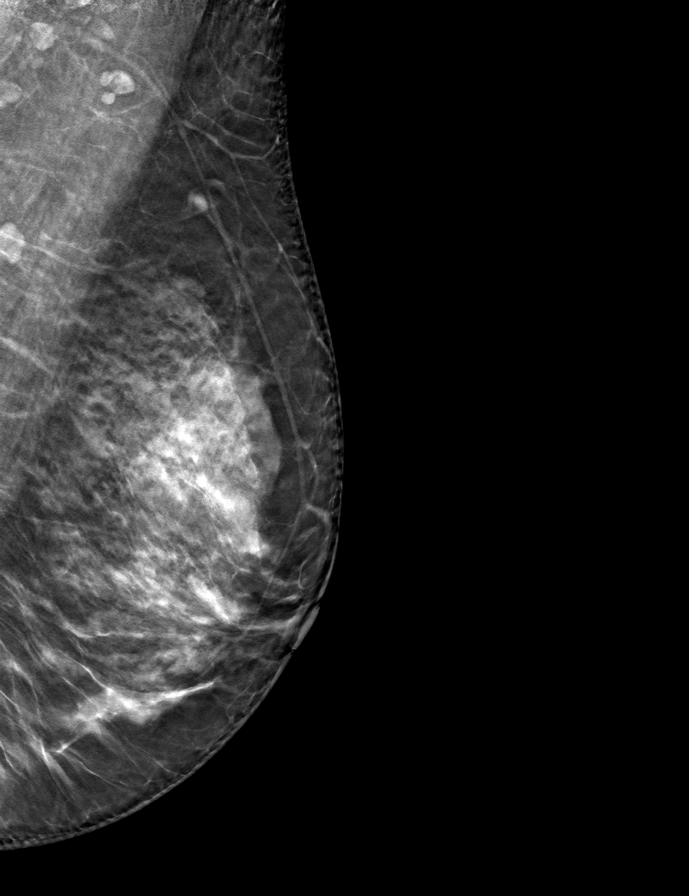
[frame 35/69]
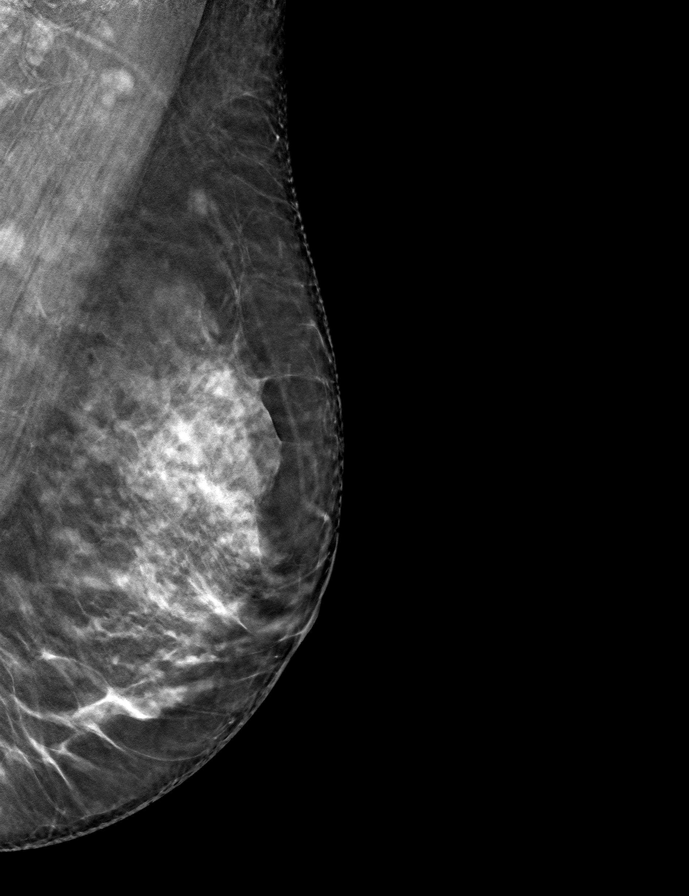

[R CC tomo · tomo slice 37/74.0]
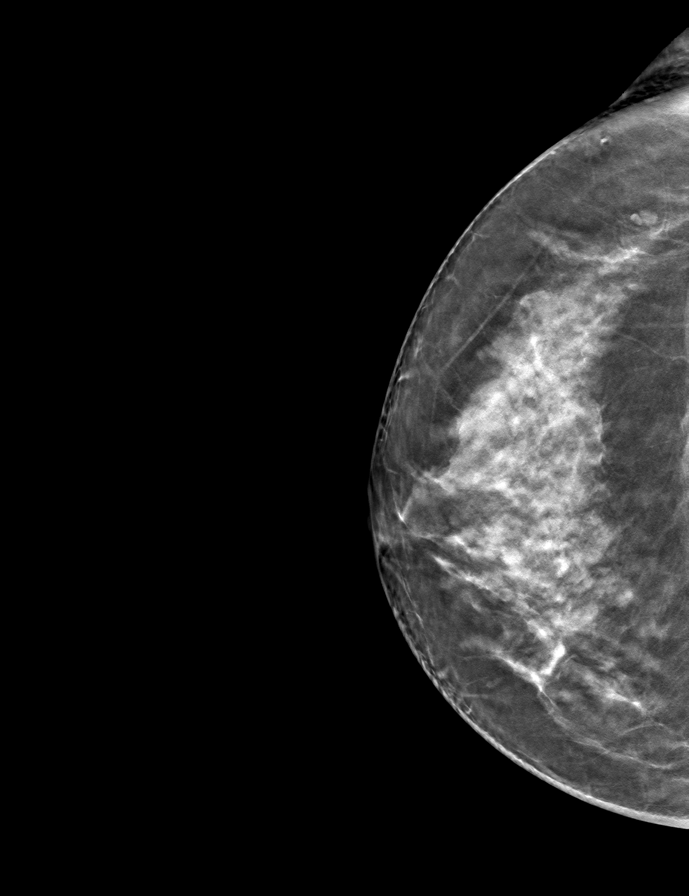

[L CC tomo · tomo slice 35/70.0]
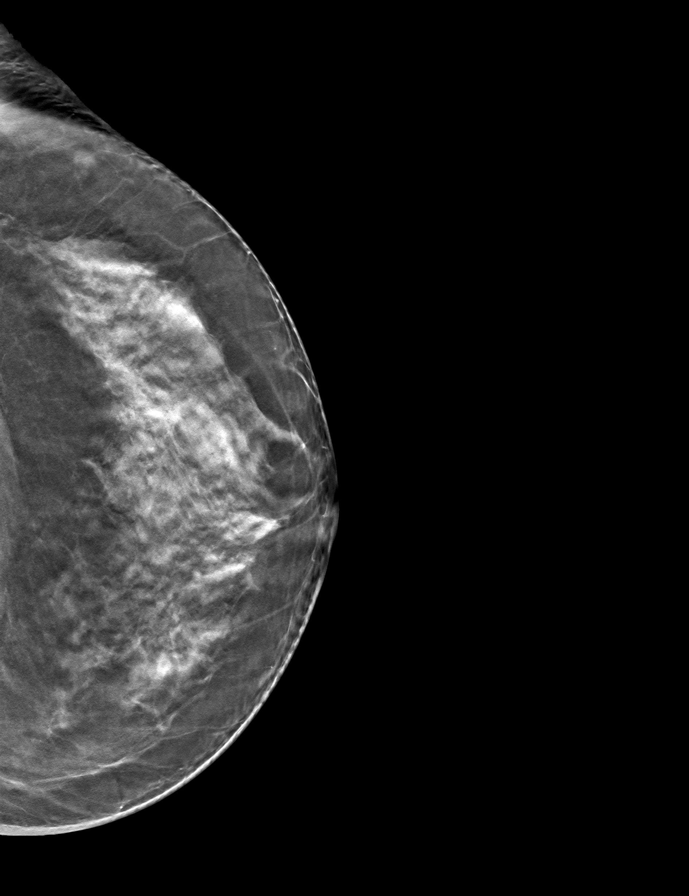

[R MLO tomo · tomo slice 35/69.0]
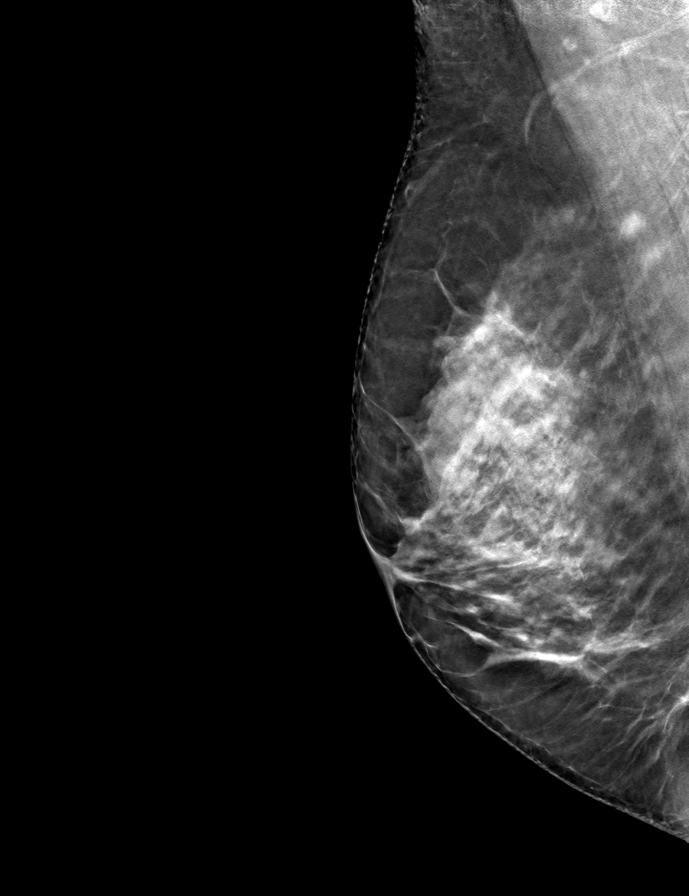

[9 of 24 positions shown; findings below may reference images not displayed]

ACR Breast Density Category c: The breast tissue is heterogeneously
dense, which may obscure small masses.
FINDINGS: There are no findings suspicious for malignancy. Images were
processed with CAD.
IMPRESSION: No mammographic evidence of malignancy. A result letter of this
screening mammogram will be mailed directly to the patient.

RECOMMENDATION:
Screening mammogram in one year. (Code:[id number])

BI-RADS CATEGORY  1: Negative.

## 2020-06-20 DIAGNOSIS — Z03818 Encounter for observation for suspected exposure to other biological agents ruled out: Secondary | ICD-10-CM | POA: Diagnosis not present

## 2020-06-20 DIAGNOSIS — Z7189 Other specified counseling: Secondary | ICD-10-CM | POA: Diagnosis not present

## 2020-06-20 DIAGNOSIS — E039 Hypothyroidism, unspecified: Secondary | ICD-10-CM | POA: Diagnosis not present

## 2020-08-31 ENCOUNTER — Other Ambulatory Visit: Payer: Self-pay | Admitting: Nurse Practitioner

## 2020-08-31 DIAGNOSIS — G43019 Migraine without aura, intractable, without status migrainosus: Secondary | ICD-10-CM

## 2020-09-01 NOTE — Telephone Encounter (Signed)
Chart supports Rx Last ov 10/22/19 Next ov 09/16/20

## 2020-09-16 ENCOUNTER — Telehealth: Payer: Self-pay | Admitting: Nurse Practitioner

## 2020-09-16 ENCOUNTER — Other Ambulatory Visit: Payer: Self-pay

## 2020-09-16 ENCOUNTER — Ambulatory Visit (INDEPENDENT_AMBULATORY_CARE_PROVIDER_SITE_OTHER): Payer: Medicare HMO | Admitting: Nurse Practitioner

## 2020-09-16 ENCOUNTER — Encounter: Payer: Self-pay | Admitting: Nurse Practitioner

## 2020-09-16 VITALS — BP 108/70 | HR 68 | Temp 96.8°F | Ht 63.0 in | Wt 132.4 lb

## 2020-09-16 DIAGNOSIS — F325 Major depressive disorder, single episode, in full remission: Secondary | ICD-10-CM

## 2020-09-16 DIAGNOSIS — E782 Mixed hyperlipidemia: Secondary | ICD-10-CM

## 2020-09-16 DIAGNOSIS — G43019 Migraine without aura, intractable, without status migrainosus: Secondary | ICD-10-CM

## 2020-09-16 DIAGNOSIS — R69 Illness, unspecified: Secondary | ICD-10-CM | POA: Diagnosis not present

## 2020-09-16 DIAGNOSIS — D649 Anemia, unspecified: Secondary | ICD-10-CM | POA: Diagnosis not present

## 2020-09-16 DIAGNOSIS — E042 Nontoxic multinodular goiter: Secondary | ICD-10-CM | POA: Diagnosis not present

## 2020-09-16 MED ORDER — FLUOXETINE HCL 20 MG PO CAPS: 20.0000 mg | ORAL_CAPSULE | Freq: Every day | ORAL | 3 refills | Status: DC

## 2020-09-16 NOTE — Assessment & Plan Note (Signed)
Repeat lipid panel ?

## 2020-09-16 NOTE — Assessment & Plan Note (Signed)
No ABD pain, nausea, weight loss, melena or hematochezia. No change in bowel pattern  Repeat cbc and iron panel She declined referral to GI for colonoscopy She agreed to complete serial iFOB. Order entered and kit provided

## 2020-09-16 NOTE — Telephone Encounter (Signed)
Left message for patient to schedule Annual Wellness Visit.  Please schedule with Nurse Health Advisor Martha Stanley, RN at [facility] [place] [facility]  °

## 2020-09-16 NOTE — Assessment & Plan Note (Signed)
Use of fioricet prn Acute episode once a month

## 2020-09-16 NOTE — Progress Notes (Signed)
Subjective:  Patient ID: Benedetto Goad, female    DOB: [birth year]  Age: 65 y.o. MRN: [id number]  CC: Follow-up (6 month f/u on Depression, hyperlipidemia and migraines. )  HPI  Major depressive disorder with single episode, in full remission ([place]) Stable mood with fluoxetine. Refill sent  Anemia No ABD pain, nausea, weight loss, melena or hematochezia. No change in bowel pattern  Repeat cbc and iron panel She declined referral to GI for colonoscopy She agreed to complete serial iFOB. Order entered and kit provided  Hyperlipidemia Repeat lipid panel  Intractable migraine without aura and without status migrainosus Use of fioricet prn Acute episode once a month  Depression screen [facility] 2/9 09/16/2020 10/22/2019 10/22/2019  Decreased Interest 0 0 0  Down, Depressed, Hopeless 0 0 0  PHQ - 2 Score 0 0 0  Altered sleeping 0 0 -  Tired, decreased energy 1 1 -  Change in appetite 0 0 -  Feeling bad or failure about yourself  0 0 -  Trouble concentrating 0 0 -  Moving slowly or fidgety/restless 0 0 -  Suicidal thoughts 0 0 -  PHQ-9 Score 1 1 -  Difficult doing work/chores Not difficult at all Not difficult at all -   GAD 7 : Generalized Anxiety Score 09/16/2020 10/22/2019 04/30/2019  Nervous, Anxious, on Edge 0 0 1  Control/stop worrying 0 0 0  Worry too much - different things 0 0 0  Trouble relaxing 0 0 1  Restless 0 0 0  Easily annoyed or irritable 0 0 1  Afraid - awful might happen 0 0 0  Total GAD 7 Score 0 0 3  Anxiety Difficulty - - Not difficult at all   Reviewed past Medical, Social and Family history today.  Outpatient Medications Prior to Visit  Medication Sig Dispense Refill  . butalbital-acetaminophen-caffeine (FIORICET) 50-325-40 MG tablet TAKE 1 TABLET BY MOUTH EVERY 6 (SIX) HOURS AS NEEDED FOR HEADACHE. 12 tablet 0  . calcium carbonate (TUMS - DOSED IN MG ELEMENTAL CALCIUM) 500 MG chewable tablet Chew 1 tablet by mouth daily.    . DiphenhydrAMINE HCl (BENADRYL  ALLERGY PO) Take 2 tablets by mouth daily.    . fluticasone (FLONASE) 50 MCG/ACT nasal spray Place 2 sprays into the nose daily. 16 g 6  . FLUoxetine (PROZAC) 20 MG capsule Take 1 capsule (20 mg total) by mouth daily. 90 capsule 3   No facility-administered medications prior to visit.    ROS See HPI  Objective:  BP 108/70 (BP Location: Left Arm, Patient Position: Sitting, Cuff Size: Normal)   Pulse 68   Temp (!) 96.8 F (36 C) (Temporal)   Ht _0  (1.6 m)   Wt 132 lb 6.4 oz (60.1 kg)   SpO2 98%   BMI 23.45 kg/m   Physical Exam Neck:     Thyroid: No thyroid mass, thyromegaly or thyroid tenderness.  Cardiovascular:     Rate and Rhythm: Normal rate and regular rhythm.     Pulses: Normal pulses.     Heart sounds: Normal heart sounds.  Pulmonary:     Effort: Pulmonary effort is normal.     Breath sounds: Normal breath sounds.  Musculoskeletal:     Cervical back: Normal range of motion and neck supple.     Right lower leg: No edema.     Left lower leg: No edema.  Lymphadenopathy:     Cervical: No cervical adenopathy.  Neurological:     Mental Status: She is  alert and oriented to person, place, and time.  Psychiatric:        Mood and Affect: Mood normal.        Behavior: Behavior normal.        Thought Content: Thought content normal.    Assessment & Plan:  This visit occurred during the SARS-CoV-2 public health emergency.  Safety protocols were in place, including screening questions prior to the visit, additional usage of staff PPE, and extensive cleaning of exam room while observing appropriate contact time as indicated for disinfecting solutions.   Chantrell was seen today for follow-up.  Diagnoses and all orders for this visit:  Major depressive disorder with single episode, in full remission ([place]) -     FLUoxetine (PROZAC) 20 MG capsule; Take 1 capsule (20 mg total) by mouth daily.  Mixed hyperlipidemia -     Comprehensive metabolic panel; Future -     Lipid  panel; Future  GOITER, MULTINODULAR -     Thyroid Panel With TSH; Future  Anemia, unspecified type -     CBC w/Diff; Future -     Iron, TIBC and Ferritin Panel; Future -     Cancel: Fecal Occult Blood, Guaiac; Standing -     Fecal occult blood, imunochemical; Standing  Intractable migraine without aura and without status migrainosus   Problem List Items Addressed This Visit      Cardiovascular and Mediastinum   Intractable migraine without aura and without status migrainosus    Use of fioricet prn Acute episode once a month      Relevant Medications   FLUoxetine (PROZAC) 20 MG capsule     Endocrine   GOITER, MULTINODULAR   Relevant Orders   Thyroid Panel With TSH     Other   Anemia    No ABD pain, nausea, weight loss, melena or hematochezia. No change in bowel pattern  Repeat cbc and iron panel She declined referral to GI for colonoscopy She agreed to complete serial iFOB. Order entered and kit provided      Relevant Orders   CBC w/Diff   Iron, TIBC and Ferritin Panel   Fecal occult blood, imunochemical   Hyperlipidemia    Repeat lipid panel      Relevant Orders   Comprehensive metabolic panel   Lipid panel   Major depressive disorder with single episode, in full remission ([place]) - Primary    Stable mood with fluoxetine. Refill sent      Relevant Medications   FLUoxetine (PROZAC) 20 MG capsule      Follow-up: Return in about 1 year (around 09/16/2021) for CPE (fasting).  Wilfred Lacy, NP

## 2020-09-16 NOTE — Assessment & Plan Note (Addendum)
Stable mood with fluoxetine Refill sent 

## 2020-09-16 NOTE — Addendum Note (Signed)
Addended by: Lynnea Ferrier on: 09/16/2020 03:07 PM   Modules accepted: Orders

## 2020-09-16 NOTE — Patient Instructions (Addendum)
Schedule appt for annual mammogram  Go to lab for stool kit  Schedule lab appt. You need to be fasting 6-8hrs prior to blood draw. Ok to drink water.

## 2020-09-29 ENCOUNTER — Other Ambulatory Visit (INDEPENDENT_AMBULATORY_CARE_PROVIDER_SITE_OTHER): Payer: Medicare HMO

## 2020-09-29 ENCOUNTER — Other Ambulatory Visit: Payer: Self-pay

## 2020-09-29 DIAGNOSIS — E042 Nontoxic multinodular goiter: Secondary | ICD-10-CM | POA: Diagnosis not present

## 2020-09-29 DIAGNOSIS — E782 Mixed hyperlipidemia: Secondary | ICD-10-CM

## 2020-09-29 DIAGNOSIS — D649 Anemia, unspecified: Secondary | ICD-10-CM | POA: Diagnosis not present

## 2020-09-29 LAB — COMPREHENSIVE METABOLIC PANEL
ALT: 8 U/L (ref 0–35)
AST: 14 U/L (ref 0–37)
Albumin: 4.4 g/dL (ref 3.5–5.2)
Alkaline Phosphatase: 69 U/L (ref 39–117)
BUN: 9 mg/dL (ref 6–23)
CO2: 29 mEq/L (ref 19–32)
Calcium: 9.5 mg/dL (ref 8.4–10.5)
Chloride: 103 mEq/L (ref 96–112)
Creatinine, Ser: 0.69 mg/dL (ref 0.40–1.20)
GFR: 90.05 mL/min (ref >60.00)
Glucose, Bld: 84 mg/dL (ref 70–99)
Potassium: 4 mEq/L (ref 3.5–5.1)
Sodium: 139 mEq/L (ref 135–145)
Total Bilirubin: 0.4 mg/dL (ref 0.2–1.2)
Total Protein: 6.8 g/dL (ref 6.0–8.3)

## 2020-09-29 LAB — CBC WITH DIFFERENTIAL/PLATELET
Basophils Absolute: 0 10*3/uL (ref 0.0–0.1)
Basophils Relative: 0.8 % (ref 0.0–3.0)
Eosinophils Absolute: 0.1 10*3/uL (ref 0.0–0.7)
Eosinophils Relative: 1.2 % (ref 0.0–5.0)
HCT: 37 % (ref 36.0–46.0)
Hemoglobin: 12.4 g/dL (ref 12.0–15.0)
Lymphocytes Relative: 37.9 % (ref 12.0–46.0)
Lymphs Abs: 1.7 10*3/uL (ref 0.7–4.0)
MCHC: 33.5 g/dL (ref 30.0–36.0)
MCV: 87.6 fl (ref 78.0–100.0)
Monocytes Absolute: 0.4 10*3/uL (ref 0.1–1.0)
Monocytes Relative: 8.3 % (ref 3.0–12.0)
Neutro Abs: 2.3 10*3/uL (ref 1.4–7.7)
Neutrophils Relative %: 51.8 % (ref 43.0–77.0)
Platelets: 223 10*3/uL (ref 150.0–400.0)
RBC: 4.22 Mil/uL (ref 3.87–5.11)
RDW: 13.7 % (ref 11.5–15.5)
WBC: 4.5 10*3/uL (ref 4.0–10.5)

## 2020-09-29 LAB — LIPID PANEL
Cholesterol: 322 mg/dL — ABNORMAL HIGH (ref 0–200)
HDL: 61.5 mg/dL (ref >39.00)
LDL Cholesterol: 234 mg/dL — ABNORMAL HIGH (ref 0–99)
NonHDL: 260.26
Total CHOL/HDL Ratio: 5
Triglycerides: 132 mg/dL (ref 0.0–149.0)
VLDL: 26.4 mg/dL (ref 0.0–40.0)

## 2020-09-30 LAB — IRON,TIBC AND FERRITIN PANEL
%SAT: 24 % (calc) (ref 16–45)
Ferritin: 11 ng/mL — ABNORMAL LOW (ref 16–288)
Iron: 85 ug/dL (ref 45–160)
TIBC: 349 mcg/dL (calc) (ref 250–450)

## 2020-09-30 LAB — THYROID PANEL WITH TSH
Free Thyroxine Index: 2.3 (ref 1.4–3.8)
T3 Uptake: 31 % (ref 22–35)
T4, Total: 7.3 ug/dL (ref 5.1–11.9)
TSH: 1.71 mIU/L (ref 0.40–4.50)

## 2020-10-28 ENCOUNTER — Telehealth: Payer: Self-pay | Admitting: Nurse Practitioner

## 2020-10-28 NOTE — Telephone Encounter (Signed)
Left message for patient to schedule Annual Wellness Visit.  Please schedule with Nurse Health Advisor Martha Stanley, RN at [place] [facility]  °

## 2020-11-10 ENCOUNTER — Other Ambulatory Visit: Payer: Self-pay

## 2020-11-10 ENCOUNTER — Ambulatory Visit: Payer: Medicare HMO | Admitting: Dermatology

## 2020-11-10 DIAGNOSIS — L729 Follicular cyst of the skin and subcutaneous tissue, unspecified: Secondary | ICD-10-CM | POA: Diagnosis not present

## 2020-11-10 DIAGNOSIS — B351 Tinea unguium: Secondary | ICD-10-CM | POA: Diagnosis not present

## 2020-11-10 NOTE — Patient Instructions (Addendum)
5mm cyst upper back 575.00 and add pathology its separate    [name]. [name], M.D., is a board certified dermatologist To make an appointment or to request a callback for a non-urgent matter, please call our main phone line or send [place] an email.  Phone: [phone] Fax: [phone] Email: [email or web address] Please do not attach nor reference personal health information in your message; instead, please correspond using one of our secure messaging platforms: [email or web address] or the [facility] Portal link

## 2020-11-22 ENCOUNTER — Encounter: Payer: Self-pay | Admitting: Dermatology

## 2020-11-22 DIAGNOSIS — B351 Tinea unguium: Secondary | ICD-10-CM

## 2020-11-24 ENCOUNTER — Encounter: Payer: Self-pay | Admitting: Dermatology

## 2020-11-29 NOTE — Progress Notes (Signed)
   Follow-Up Visit   Subjective  Sarah Reid is a 65 y.o. female who presents for the following: Follow-up (On finger nails fungus. Patient says they are still detached and discolored. Treatment topical clindamycin. Not much improvement. ).  Ongoing  discoloration and lifting of fingernails Location:  Duration:  Quality:  Associated Signs/Symptoms: Modifying Factors: Topical clindamycin if no benefit Severity:  Timing: Context:   Objective  Well appearing patient in no apparent distress; mood and affect are within normal limits. Left Thumb Proximal Nail Fold, Right Thumb Nail Plate Both thumb nails with two thirds of the nail plate showing greenish discoloration suggestive of Pseudomonas but unresponsive to topical clindamycin.  Previous culture showed Candida parapsilosis.     Neck - Posterior 8 mm deep dermal noninflamed white papule    A focused examination was performed including hands, fingernails. Relevant physical exam findings are noted in the Assessment and Plan.   Assessment & Plan    Onychomycosis (2) Left Thumb Proximal Nail Fold; Right Thumb Nail Plate  Sarah Reid. Sarah Reid, M.D., is a board certified dermatologist Dr Denna Haggard to submit photo for therapy suggestions   Cyst of skin Neck - Posterior  I discussed scheduling elective surgery.      I, Sarah Monarch, MD, have reviewed all documentation for this visit.  The documentation on 11/29/20 for the exam, diagnosis, procedures, and orders are all accurate and complete.

## 2020-12-09 NOTE — Telephone Encounter (Signed)
-----   Message from [name], MD sent at 12/09/2020  1:16 PM EDT ----- Regarding: RE: Medication Was this medication ever phoned in?.  If not, I would have her take fluconazole 100 mg 2 pills on Monday and 2 pills on Friday for 6weeks (#24, no refill)..  If this medicine is helpful she will not see any improvement for at least 4 to 6 weeks after the medicine is finished.  Also, do we know whether we were able to send Dr. Bartholomew Boards the photographs of this woman's nails? ----- Message ----- From: Lennie Odor, CMA Sent: 11/25/2020   4:23 PM EDT To: [name], MD Subject: Medication                                     What medication do you want to send in for the patient's finger nails?

## 2020-12-11 MED ORDER — FLUCONAZOLE 100 MG PO TABS: ORAL_TABLET | ORAL | 0 refills | Status: DC

## 2020-12-30 ENCOUNTER — Other Ambulatory Visit: Payer: Self-pay | Admitting: Dermatology

## 2020-12-30 DIAGNOSIS — B351 Tinea unguium: Secondary | ICD-10-CM

## 2021-01-12 DIAGNOSIS — H524 Presbyopia: Secondary | ICD-10-CM | POA: Diagnosis not present

## 2021-01-19 ENCOUNTER — Other Ambulatory Visit: Payer: Self-pay | Admitting: Nurse Practitioner

## 2021-01-19 DIAGNOSIS — G43019 Migraine without aura, intractable, without status migrainosus: Secondary | ICD-10-CM

## 2021-01-19 DIAGNOSIS — Z0100 Encounter for examination of eyes and vision without abnormal findings: Secondary | ICD-10-CM | POA: Diagnosis not present

## 2021-02-16 ENCOUNTER — Ambulatory Visit: Payer: Medicare HMO | Admitting: Dermatology

## 2021-04-21 ENCOUNTER — Other Ambulatory Visit: Payer: Self-pay | Admitting: Dermatology

## 2021-07-15 ENCOUNTER — Telehealth: Payer: Self-pay | Admitting: Dermatology

## 2021-07-15 NOTE — Telephone Encounter (Signed)
Called patient to let her know that I called dr [name] office as well - they do require referral so I faxed referral to [phone]. Called patient to let her know that they will be calling her to schedule appointment and if not to call that office ans check status of referral.

## 2021-07-15 NOTE — Telephone Encounter (Signed)
ST told her to call [facility] office in [place] for an appt if med didn't work. She tried calling but was told they need a formal referral from [place] because otherwise they're not taking new patients.

## 2021-08-06 DIAGNOSIS — L601 Onycholysis: Secondary | ICD-10-CM | POA: Diagnosis not present

## 2021-09-18 ENCOUNTER — Encounter: Payer: Medicare HMO | Admitting: Nurse Practitioner

## 2021-10-14 ENCOUNTER — Other Ambulatory Visit: Payer: Self-pay | Admitting: Nurse Practitioner

## 2021-10-14 DIAGNOSIS — F325 Major depressive disorder, single episode, in full remission: Secondary | ICD-10-CM

## 2021-10-16 NOTE — Telephone Encounter (Signed)
Patient has not been seen in over a year and appointment is needed.  ?

## 2021-11-03 DIAGNOSIS — L601 Onycholysis: Secondary | ICD-10-CM | POA: Diagnosis not present

## 2021-12-23 ENCOUNTER — Telehealth: Payer: Self-pay

## 2021-12-23 NOTE — Telephone Encounter (Signed)
Spoke with patient and she declined scheduling the mammogram at this time. She stated that she will call back at a later date.

## 2022-03-01 DIAGNOSIS — L601 Onycholysis: Secondary | ICD-10-CM | POA: Diagnosis not present

## 2022-03-16 DIAGNOSIS — H524 Presbyopia: Secondary | ICD-10-CM | POA: Diagnosis not present

## 2022-03-17 DIAGNOSIS — Z0100 Encounter for examination of eyes and vision without abnormal findings: Secondary | ICD-10-CM | POA: Diagnosis not present

## 2022-04-13 ENCOUNTER — Other Ambulatory Visit: Payer: Self-pay | Admitting: Nurse Practitioner

## 2022-04-13 DIAGNOSIS — F325 Major depressive disorder, single episode, in full remission: Secondary | ICD-10-CM

## 2022-05-07 ENCOUNTER — Other Ambulatory Visit: Payer: Self-pay | Admitting: Nurse Practitioner

## 2022-05-07 DIAGNOSIS — F325 Major depressive disorder, single episode, in full remission: Secondary | ICD-10-CM

## 2022-05-07 NOTE — Telephone Encounter (Signed)
  Last OV:  Next OV:

## 2022-05-25 ENCOUNTER — Encounter: Payer: Self-pay | Admitting: Nurse Practitioner

## 2022-05-25 ENCOUNTER — Ambulatory Visit (INDEPENDENT_AMBULATORY_CARE_PROVIDER_SITE_OTHER): Payer: Medicare HMO | Admitting: Nurse Practitioner

## 2022-05-25 VITALS — BP 112/62 | HR 60 | Temp 97.5°F | Ht 63.0 in | Wt 128.6 lb

## 2022-05-25 DIAGNOSIS — Z1231 Encounter for screening mammogram for malignant neoplasm of breast: Secondary | ICD-10-CM | POA: Diagnosis not present

## 2022-05-25 DIAGNOSIS — D72819 Decreased white blood cell count, unspecified: Secondary | ICD-10-CM

## 2022-05-25 DIAGNOSIS — R69 Illness, unspecified: Secondary | ICD-10-CM | POA: Diagnosis not present

## 2022-05-25 DIAGNOSIS — Z23 Encounter for immunization: Secondary | ICD-10-CM

## 2022-05-25 DIAGNOSIS — Z1211 Encounter for screening for malignant neoplasm of colon: Secondary | ICD-10-CM

## 2022-05-25 DIAGNOSIS — F325 Major depressive disorder, single episode, in full remission: Secondary | ICD-10-CM | POA: Diagnosis not present

## 2022-05-25 DIAGNOSIS — E042 Nontoxic multinodular goiter: Secondary | ICD-10-CM | POA: Diagnosis not present

## 2022-05-25 DIAGNOSIS — E782 Mixed hyperlipidemia: Secondary | ICD-10-CM | POA: Diagnosis not present

## 2022-05-25 MED ORDER — FLUOXETINE HCL 20 MG PO CAPS: 20.0000 mg | ORAL_CAPSULE | Freq: Every day | ORAL | 3 refills | Status: DC

## 2022-05-25 NOTE — Progress Notes (Signed)
Established Patient Visit  Patient: Sarah Reid   DOB: 08-03-1955   66 y.o. Female  MRN: [id number] Visit Date: 05/25/2022  Subjective:    Chief Complaint  Patient presents with   Office Visit    Medication refills  Pnuemonia & flu vaccine given today    HPI GOITER, MULTINODULAR Repeat TSH  Major depressive disorder with single episode, in full remission ([place]) Stable mood with fluoxetine.  Hyperlipidemia Repeat lipid panel Advised about the importance of heart healthy diet and daily exercise  Leukocytopenia Repeat cbc  Reviewed medical, surgical, and social history today  Medications: Outpatient Medications Prior to Visit  Medication Sig   butalbital-acetaminophen-caffeine (FIORICET) 50-325-40 MG tablet TAKE 1 TABLET BY MOUTH EVERY 6 HOURS AS NEEDED FOR HEADACHE.   calcium carbonate (TUMS - DOSED IN MG ELEMENTAL CALCIUM) 500 MG chewable tablet Chew 1 tablet by mouth daily.   DiphenhydrAMINE HCl (BENADRYL ALLERGY PO) Take 2 tablets by mouth daily.   fluticasone (FLONASE) 50 MCG/ACT nasal spray Place 2 sprays into the nose daily.   [DISCONTINUED] FLUoxetine (PROZAC) 20 MG capsule Take 1 capsule (20 mg total) by mouth daily.   [DISCONTINUED] fluconazole (DIFLUCAN) 100 MG tablet fluconazole 100 mg 2 pills on Monday and 2 pills on Friday for 6weeks (#24, no refill).. (Patient not taking: Reported on 05/25/2022)   No facility-administered medications prior to visit.   Reviewed past medical and social history.   ROS per HPI above      Objective:  BP 112/62 (BP Location: Right Arm, Patient Position: Sitting, Cuff Size: Small)   Pulse 60   Temp (!) 97.5 F (36.4 C) (Temporal)   Ht '5\' 3"'$  (1.6 m)   Wt 128 lb 9.6 oz (58.3 kg)   SpO2 93%   BMI 22.78 kg/m      Physical Exam Constitutional:      General: She is not in acute distress. Cardiovascular:     Rate and Rhythm: Normal rate and regular rhythm.     Pulses: Normal pulses.     Heart sounds: Normal  heart sounds.  Pulmonary:     Effort: Pulmonary effort is normal.     Breath sounds: Normal breath sounds.  Neurological:     Mental Status: She is alert and oriented to person, place, and time.  Psychiatric:        Mood and Affect: Mood normal.        Behavior: Behavior normal.        Thought Content: Thought content normal.     No results found for any visits on 05/25/22.    Assessment & Plan:    Problem List Items Addressed This Visit       Endocrine   GOITER, MULTINODULAR    Repeat TSH      Relevant Orders   TSH     Other   Hyperlipidemia - Primary    Repeat lipid panel Advised about the importance of heart healthy diet and daily exercise      Relevant Orders   Comprehensive metabolic panel   Lipid panel   Leukocytopenia    Repeat cbc      Relevant Orders   CBC   Major depressive disorder with single episode, in full remission ([place])    Stable mood with fluoxetine.      Relevant Medications   FLUoxetine (PROZAC) 20 MG capsule   Other Visit Diagnoses  Breast cancer screening by mammogram       Relevant Orders   MM 3D SCREEN BREAST BILATERAL   Colon cancer screening       Relevant Orders   Cologuard   Need for vaccination against Streptococcus pneumoniae       Relevant Orders   Pneumococcal conjugate vaccine 20-valent (Prevnar 20) (Completed)   Need for influenza vaccination       Relevant Orders   Flu Vaccine QUAD High Dose(Fluad) (Completed)      Return in about 1 year (around 05/26/2023) for CPE (fasting).     Wilfred Lacy, NP

## 2022-05-25 NOTE — Assessment & Plan Note (Signed)
Stable mood with fluoxetine.

## 2022-05-25 NOTE — Patient Instructions (Signed)
Schedule fasting lab appt. Maintain current medication. Schedule appt for mammogram. Submit cologuard kit as soon as possible.

## 2022-05-25 NOTE — Assessment & Plan Note (Signed)
Repeat cbc

## 2022-05-25 NOTE — Assessment & Plan Note (Signed)
Repeat lipid panel Advised about the importance of heart healthy diet and daily exercise

## 2022-05-25 NOTE — Assessment & Plan Note (Signed)
Repeat TSH

## 2022-06-02 ENCOUNTER — Other Ambulatory Visit: Payer: Self-pay | Admitting: Nurse Practitioner

## 2022-06-02 DIAGNOSIS — G43019 Migraine without aura, intractable, without status migrainosus: Secondary | ICD-10-CM

## 2022-06-03 NOTE — Telephone Encounter (Signed)
Chart supports Rx Last OV: 05/2022 Next OV: 06/2022  Okay to fill?

## 2022-06-04 MED ORDER — BUTALBITAL-APAP-CAFFEINE 50-325-40 MG PO TABS: 1.0000 | ORAL_TABLET | Freq: Four times a day (QID) | ORAL | 0 refills | Status: DC | PRN

## 2022-06-25 ENCOUNTER — Other Ambulatory Visit (INDEPENDENT_AMBULATORY_CARE_PROVIDER_SITE_OTHER): Payer: Medicare HMO

## 2022-06-25 DIAGNOSIS — E042 Nontoxic multinodular goiter: Secondary | ICD-10-CM | POA: Diagnosis not present

## 2022-06-25 DIAGNOSIS — E782 Mixed hyperlipidemia: Secondary | ICD-10-CM

## 2022-06-25 DIAGNOSIS — D72819 Decreased white blood cell count, unspecified: Secondary | ICD-10-CM | POA: Diagnosis not present

## 2022-06-25 LAB — COMPREHENSIVE METABOLIC PANEL
ALT: 12 U/L (ref 0–35)
AST: 16 U/L (ref 0–37)
Albumin: 4.4 g/dL (ref 3.5–5.2)
Alkaline Phosphatase: 73 U/L (ref 39–117)
BUN: 8 mg/dL (ref 6–23)
CO2: 30 mEq/L (ref 19–32)
Calcium: 9.2 mg/dL (ref 8.4–10.5)
Chloride: 103 mEq/L (ref 96–112)
Creatinine, Ser: 0.62 mg/dL (ref 0.40–1.20)
GFR: 91.28 mL/min (ref >60.00)
Glucose, Bld: 93 mg/dL (ref 70–99)
Potassium: 4.1 mEq/L (ref 3.5–5.1)
Sodium: 139 mEq/L (ref 135–145)
Total Bilirubin: 0.3 mg/dL (ref 0.2–1.2)
Total Protein: 6.5 g/dL (ref 6.0–8.3)

## 2022-06-25 LAB — LIPID PANEL
Cholesterol: 322 mg/dL — ABNORMAL HIGH (ref 0–200)
HDL: 63.8 mg/dL (ref >39.00)
LDL Cholesterol: 237 mg/dL — ABNORMAL HIGH (ref 0–99)
NonHDL: 258.44
Total CHOL/HDL Ratio: 5
Triglycerides: 108 mg/dL (ref 0.0–149.0)
VLDL: 21.6 mg/dL (ref 0.0–40.0)

## 2022-06-25 LAB — CBC
HCT: 35.5 % — ABNORMAL LOW (ref 36.0–46.0)
Hemoglobin: 12 g/dL (ref 12.0–15.0)
MCHC: 33.8 g/dL (ref 30.0–36.0)
MCV: 87.9 fl (ref 78.0–100.0)
Platelets: 233 10*3/uL (ref 150.0–400.0)
RBC: 4.04 Mil/uL (ref 3.87–5.11)
RDW: 13.6 % (ref 11.5–15.5)
WBC: 4.5 10*3/uL (ref 4.0–10.5)

## 2022-06-25 LAB — TSH: TSH: 1.74 u[IU]/mL (ref 0.35–5.50)

## 2022-07-12 ENCOUNTER — Encounter: Payer: Self-pay | Admitting: Nurse Practitioner

## 2022-07-12 DIAGNOSIS — E782 Mixed hyperlipidemia: Secondary | ICD-10-CM

## 2022-07-21 ENCOUNTER — Ambulatory Visit
Admission: RE | Admit: 2022-07-21 | Discharge: 2022-07-21 | Disposition: A | Discharge status: Home / Self Care | Payer: Medicare HMO | Source: Ambulatory Visit | Attending: Nurse Practitioner | Admitting: Nurse Practitioner

## 2022-07-21 DIAGNOSIS — E782 Mixed hyperlipidemia: Secondary | ICD-10-CM

## 2022-07-22 ENCOUNTER — Telehealth: Payer: Self-pay | Admitting: Nurse Practitioner

## 2022-07-22 ENCOUNTER — Ambulatory Visit
Admission: RE | Admit: 2022-07-22 | Discharge: 2022-07-22 | Disposition: A | Discharge status: Home / Self Care | Payer: Medicare HMO | Source: Ambulatory Visit | Attending: Nurse Practitioner | Admitting: Nurse Practitioner

## 2022-07-22 DIAGNOSIS — Z1231 Encounter for screening mammogram for malignant neoplasm of breast: Secondary | ICD-10-CM | POA: Diagnosis not present

## 2022-07-22 NOTE — Telephone Encounter (Signed)
Spoke to patient to schedule Medicare Annual Wellness Visit (AWV) either virtually or in office.   Patient declined stating she does not need appt Do not call    Last AWV 10/24/19 please schedule with Nurse Health Adviser   45 min for awv-i  in office appointments 30 min for awv-s & awv-i phone/virtual appointments

## 2022-07-28 NOTE — Progress Notes (Signed)
Called patient and made aware of results

## 2023-01-25 DIAGNOSIS — R051 Acute cough: Secondary | ICD-10-CM | POA: Diagnosis not present

## 2023-01-25 DIAGNOSIS — U071 COVID-19: Secondary | ICD-10-CM | POA: Diagnosis not present

## 2023-03-18 DIAGNOSIS — Z0100 Encounter for examination of eyes and vision without abnormal findings: Secondary | ICD-10-CM | POA: Diagnosis not present

## 2023-03-18 DIAGNOSIS — H52223 Regular astigmatism, bilateral: Secondary | ICD-10-CM | POA: Diagnosis not present

## 2023-05-27 ENCOUNTER — Encounter: Payer: Self-pay | Admitting: Nurse Practitioner

## 2023-05-27 ENCOUNTER — Ambulatory Visit (INDEPENDENT_AMBULATORY_CARE_PROVIDER_SITE_OTHER): Payer: Medicare HMO | Admitting: Nurse Practitioner

## 2023-05-27 VITALS — BP 118/62 | HR 55 | Temp 98.0°F | Resp 18 | Ht 63.0 in | Wt 131.4 lb

## 2023-05-27 DIAGNOSIS — E782 Mixed hyperlipidemia: Secondary | ICD-10-CM

## 2023-05-27 DIAGNOSIS — M25561 Pain in right knee: Secondary | ICD-10-CM | POA: Diagnosis not present

## 2023-05-27 DIAGNOSIS — D1723 Benign lipomatous neoplasm of skin and subcutaneous tissue of right leg: Secondary | ICD-10-CM | POA: Diagnosis not present

## 2023-05-27 DIAGNOSIS — R911 Solitary pulmonary nodule: Secondary | ICD-10-CM | POA: Diagnosis not present

## 2023-05-27 DIAGNOSIS — Z1211 Encounter for screening for malignant neoplasm of colon: Secondary | ICD-10-CM

## 2023-05-27 DIAGNOSIS — D649 Anemia, unspecified: Secondary | ICD-10-CM | POA: Diagnosis not present

## 2023-05-27 DIAGNOSIS — B351 Tinea unguium: Secondary | ICD-10-CM | POA: Diagnosis not present

## 2023-05-27 DIAGNOSIS — G43019 Migraine without aura, intractable, without status migrainosus: Secondary | ICD-10-CM | POA: Diagnosis not present

## 2023-05-27 DIAGNOSIS — F325 Major depressive disorder, single episode, in full remission: Secondary | ICD-10-CM

## 2023-05-27 DIAGNOSIS — Z0001 Encounter for general adult medical examination with abnormal findings: Secondary | ICD-10-CM | POA: Diagnosis not present

## 2023-05-27 DIAGNOSIS — Z23 Encounter for immunization: Secondary | ICD-10-CM | POA: Diagnosis not present

## 2023-05-27 LAB — CBC
HCT: 36.4 % (ref 36.0–46.0)
Hemoglobin: 12.6 g/dL (ref 12.0–15.0)
MCHC: 34.6 g/dL (ref 30.0–36.0)
MCV: 89.9 fL (ref 78.0–100.0)
Platelets: 233 10*3/uL (ref 150.0–400.0)
RBC: 4.05 Mil/uL (ref 3.87–5.11)
RDW: 13.5 % (ref 11.5–15.5)
WBC: 5.7 10*3/uL (ref 4.0–10.5)

## 2023-05-27 LAB — COMPREHENSIVE METABOLIC PANEL
ALT: 11 U/L (ref 0–35)
AST: 17 U/L (ref 0–37)
Albumin: 4.8 g/dL (ref 3.5–5.2)
Alkaline Phosphatase: 71 U/L (ref 39–117)
BUN: 13 mg/dL (ref 6–23)
CO2: 29 meq/L (ref 19–32)
Calcium: 9.5 mg/dL (ref 8.4–10.5)
Chloride: 100 meq/L (ref 96–112)
Creatinine, Ser: 0.68 mg/dL (ref 0.40–1.20)
GFR: 88.7 mL/min (ref >60.00)
Glucose, Bld: 103 mg/dL — ABNORMAL HIGH (ref 70–99)
Potassium: 4.2 meq/L (ref 3.5–5.1)
Sodium: 136 meq/L (ref 135–145)
Total Bilirubin: 0.4 mg/dL (ref 0.2–1.2)
Total Protein: 7.3 g/dL (ref 6.0–8.3)

## 2023-05-27 LAB — LIPID PANEL
Cholesterol: 353 mg/dL — ABNORMAL HIGH (ref 0–200)
HDL: 58.7 mg/dL (ref >39.00)
LDL Cholesterol: 268 mg/dL — ABNORMAL HIGH (ref 0–99)
NonHDL: 294.66
Total CHOL/HDL Ratio: 6
Triglycerides: 132 mg/dL (ref 0.0–149.0)
VLDL: 26.4 mg/dL (ref 0.0–40.0)

## 2023-05-27 MED ORDER — CICLOPIROX OLAMINE 0.77 % EX CREA: TOPICAL_CREAM | Freq: Two times a day (BID) | CUTANEOUS | 0 refills | Status: AC

## 2023-05-27 MED ORDER — CICLOPIROX OLAMINE 0.77 % EX CREA: TOPICAL_CREAM | Freq: Two times a day (BID) | CUTANEOUS | 0 refills | Status: DC

## 2023-05-27 MED ORDER — BUTALBITAL-APAP-CAFFEINE 50-325-40 MG PO TABS
1.0000 | ORAL_TABLET | Freq: Four times a day (QID) | ORAL | 0 refills | Status: AC | PRN
Start: 2023-05-27 — End: ?

## 2023-05-27 MED ORDER — FLUOXETINE HCL 20 MG PO CAPS: 20.0000 mg | ORAL_CAPSULE | Freq: Every day | ORAL | 3 refills | Status: DC

## 2023-05-27 NOTE — Assessment & Plan Note (Signed)
No ABD pain, nausea, weight loss, melena or hematochezia. No change in bowel pattern  Repeat cbc  She declined referral to GI for colonoscopy She agreed to complete cologuard kit Order entered

## 2023-05-27 NOTE — Progress Notes (Signed)
Complete physical exam  Patient: Sarah Reid   DOB: Nov 23, 1955   67 y.o. Female  MRN: [id number] Visit Date: 05/27/2023  Subjective:    Chief Complaint  Patient presents with   Annual Exam    PT is due for colonoscopy, flu and shingles vaccine     Sarah Reid is a 67 y.o. female who presents today for a complete physical exam. She reports consuming a low fat and low sodium diet.  Walking 3x/week  She generally feels well. She reports sleeping well. She does have additional problems to discuss today.  Vision:Yes Dental:No STD Screen:No  BP Readings from Last 3 Encounters:  05/27/23 118/62  05/25/22 112/62  09/16/20 108/70   Wt Readings from Last 3 Encounters:  05/27/23 131 lb 6.4 oz (59.6 kg)  05/25/22 128 lb 9.6 oz (58.3 kg)  09/16/20 132 lb 6.4 oz (60.1 kg)   Most recent fall risk assessment:    05/27/2023    9:13 AM  Fall Risk   Falls in the past year? 0  Number falls in past yr: 0  Injury with Fall? 0  Risk for fall due to : No Fall Risks  Follow up Falls evaluation completed   Depression screen:Yes - Depression Most recent depression screenings:    05/27/2023    9:14 AM 05/27/2023    9:13 AM  PHQ 2/9 Scores  PHQ - 2 Score 0 0  PHQ- 9 Score 0    HPI  Pulmonary nodule, right Per CT cardiac 06/2022:Mediastinum/Nodes: No enlarged lymph nodes within the visualized Mediastinum. Lungs/Pleura: There is no pleural effusion. There is a 3 mm pulmonary nodule in the RIGHT middle lobe (series 10, image 26). Upper abdomen: No significant findings in the visualized upper abdomen. Musculoskeletal/Chest wall: No chest wall mass or suspicious osseous findings within the visualized chest.  asymptomatic No hx of tobacco use or second smoke exposure. No additional test needed at this time  Intractable migraine without aura and without status migrainosus Use of fioricet prn Acute episode once a month Refill sent  Onychomycosis Toenail Ciclopirox cream  sent  Anemia No ABD pain, nausea, weight loss, melena or hematochezia. No change in bowel pattern  Repeat cbc  She declined referral to GI for colonoscopy She agreed to complete cologuard kit Order entered  Lipoma of right thigh Mid hamstring, oval, soft, mobile, non tender, no erythema, no skin retraction/dimpling. Reports intermittent pain when sitting on hard surface. She declined referral to general surgery at this time.  Major depressive disorder with single episode, in full remission (HCC) Stable mood with fluoxetine. Refill sent  Right anterior knee pain Chronic, worse with prolong walking or standing or inactivity or going down stairs Denies any previous injury She walks 3x/week for exercise. Come relief with ibuprofen and use of copper knee brace.  No effusion or erythema noted Crepitus noted. Entered referral to sports medicine Advised to decrease walking distance.   Past Medical History:  Diagnosis Date   ALLERGIC RHINITIS 02/18/2007   ASYMPTOMATIC POSTMENOPAUSAL STATUS 11/07/2008   BRADYCARDIA 11/07/2008   DEPRESSION 11/07/2008   FIBROIDS, UTERUS 11/07/2008   GOITER, MULTINODULAR 11/07/2008   Headache(784.0) 11/07/2008   HYPERLIPIDEMIA 02/18/2007   LEUKOPENIA, CHRONIC 11/07/2008   Past Surgical History:  Procedure Laterality Date   ABDOMINAL HYSTERECTOMY  2001   Social History   Socioeconomic History   Marital status: Single    Spouse name: Not on file   Number of children: 1   Years of  education: Not on file   Highest education level: Not on file  Occupational History    Comment: [employer]  Tobacco Use   Smoking status: Never   Smokeless tobacco: Never  Vaping Use   Vaping status: Never Used  Substance and Sexual Activity   Alcohol use: Not Currently   Drug use: Never   Sexual activity: Not Currently    Birth control/protection: Surgical, Post-menopausal  Other Topics Concern   Not on file  Social History Narrative   Not  on file   Social Determinants of Health   Financial Resource Strain: Low Risk  (10/24/2019)   Overall Financial Resource Strain (CARDIA)    Difficulty of Paying Living Expenses: Not hard at all  Food Insecurity: No Food Insecurity (10/24/2019)   Hunger Vital Sign    Worried About Running Out of Food in the Last Year: Never true    Ran Out of Food in the Last Year: Never true  Transportation Needs: No Transportation Needs (10/24/2019)   PRAPARE - Administrator, Civil Service (Medical): No    Lack of Transportation (Non-Medical): No  Physical Activity: Not on file  Stress: Not on file  Social Connections: Not on file  Intimate Partner Violence: Not on file   Family Status  Relation Name Status   Mother  Deceased   Father  Deceased   Sister  Alive   Brother  Alive   Mat Uncle  (Not Specified)   Oneal Grout  (Not Specified)   Neg Hx  (Not Specified)  No partnership data on file   Family History  Problem Relation Age of Onset   Dementia Mother 11   Osteoporosis Mother    CVA Mother    Hyperlipidemia Mother    Dementia Father 61   Anxiety disorder Father    Heart disease Brother    Valvular heart disease Brother    Arrhythmia Brother        pacemaker inserted   GER disease Brother    Hyperlipidemia Brother    Heart disease Maternal Uncle    Heart disease Paternal Uncle    Cancer Neg Hx    Thyroid disease Neg Hx    No Known Allergies  Patient Care Team: Martyn Timme, Bonna Gains, NP as PCP - General (Internal Medicine) Haygood, Maris Berger, MD (Inactive) as Consulting Physician (Obstetrics and Gynecology) Janalyn Harder, MD (Inactive) as Consulting Physician (Dermatology)   Medications: Outpatient Medications Prior to Visit  Medication Sig   calcium carbonate (TUMS - DOSED IN MG ELEMENTAL CALCIUM) 500 MG chewable tablet Chew 1 tablet by mouth daily.   DiphenhydrAMINE HCl (BENADRYL ALLERGY PO) Take 2 tablets by mouth daily.   fluticasone (FLONASE) 50 MCG/ACT nasal  spray Place 2 sprays into the nose daily.   promethazine-dextromethorphan (PROMETHAZINE-DM) 6.25-15 MG/5ML syrup Take 2.5 mLs by mouth every 6 (six) hours.   [DISCONTINUED] butalbital-acetaminophen-caffeine (FIORICET) 50-325-40 MG tablet Take 1 tablet by mouth every 6 (six) hours as needed for headache.   [DISCONTINUED] FLUoxetine (PROZAC) 20 MG capsule Take 1 capsule (20 mg total) by mouth daily.   No facility-administered medications prior to visit.    Review of Systems  Constitutional:  Negative for activity change, appetite change and unexpected weight change.  Respiratory: Negative.    Cardiovascular: Negative.   Gastrointestinal: Negative.   Endocrine: Negative for cold intolerance and heat intolerance.  Genitourinary: Negative.   Musculoskeletal:  Positive for arthralgias.  Skin: Negative.   Neurological: Negative.   Hematological:  Negative.   Psychiatric/Behavioral:  Negative for behavioral problems, decreased concentration, dysphoric mood, hallucinations, self-injury, sleep disturbance and suicidal ideas. The patient is not nervous/anxious.         Objective:  BP 118/62 (BP Location: Left Arm, Patient Position: Sitting, Cuff Size: Normal)   Pulse (!) 55   Temp 98 F (36.7 C) (Temporal)   Resp 18   Ht 5\' 3"  (1.6 m)   Wt 131 lb 6.4 oz (59.6 kg)   SpO2 100%   BMI 23.28 kg/m     Physical Exam Vitals and nursing note reviewed.  Constitutional:      General: She is not in acute distress. HENT:     Right Ear: Tympanic membrane, ear canal and external ear normal.     Left Ear: Tympanic membrane, ear canal and external ear normal.     Nose: Nose normal.  Eyes:     Extraocular Movements: Extraocular movements intact.     Conjunctiva/sclera: Conjunctivae normal.     Pupils: Pupils are equal, round, and reactive to light.  Neck:     Thyroid: No thyroid mass, thyromegaly or thyroid tenderness.  Cardiovascular:     Rate and Rhythm: Normal rate and regular rhythm.      Pulses: Normal pulses.     Heart sounds: Normal heart sounds.  Pulmonary:     Effort: Pulmonary effort is normal.     Breath sounds: Normal breath sounds.  Abdominal:     General: Bowel sounds are normal.     Palpations: Abdomen is soft.  Musculoskeletal:        General: Tenderness present. Normal range of motion.     Cervical back: Normal range of motion and neck supple.     Right hip: Normal.     Left hip: Normal.     Right upper leg: Normal.     Left upper leg: Normal.     Right knee: Crepitus present. No swelling, deformity, effusion or erythema. Normal range of motion. No tenderness.     Left knee: Normal.     Right lower leg: Normal. No edema.     Left lower leg: Normal. No edema.  Lymphadenopathy:     Cervical: No cervical adenopathy.  Skin:    General: Skin is warm and dry.  Neurological:     Mental Status: She is alert and oriented to person, place, and time.     Cranial Nerves: No cranial nerve deficit.  Psychiatric:        Mood and Affect: Mood normal.        Behavior: Behavior normal.        Thought Content: Thought content normal.      No results found for any visits on 05/27/23.    Assessment & Plan:    Routine Health Maintenance and Physical Exam  Immunization History  Administered Date(s) Administered   Fluad Quad(high Dose 65+) 04/30/2019, 05/25/2022   Fluad Trivalent(High Dose 65+) 05/27/2023   Influenza Whole 05/28/2011   Influenza,inj,Quad PF,6+ Mos 04/19/2013   Janssen (J&J) SARS-COV-2 Vaccination 02/21/2020   PNEUMOCOCCAL CONJUGATE-20 05/25/2022   Pneumococcal Conjugate-13 04/30/2019   Td 01/20/2003    Health Maintenance  Topic Date Due   Hepatitis C Screening  Never done   Fecal DNA (Cologuard)  Never done   Zoster Vaccines- Shingrix (1 of 2) Never done   Medicare Annual Wellness (AWV)  10/23/2020   COVID-19 Vaccine (2 - 2023-24 season) 06/12/2023 (Originally 02/20/2023)   MAMMOGRAM  07/22/2024   Pneumonia  Vaccine 79+ Years old   Completed   INFLUENZA VACCINE  Completed   DEXA SCAN  Completed   HPV VACCINES  Aged Out   DTaP/Tdap/Td  Discontinued    Discussed health benefits of physical activity, and encouraged her to engage in regular exercise appropriate for her age and condition.  Problem List Items Addressed This Visit     Anemia    No ABD pain, nausea, weight loss, melena or hematochezia. No change in bowel pattern  Repeat cbc  She declined referral to GI for colonoscopy She agreed to complete cologuard kit Order entered      Relevant Orders   CBC   Hyperlipidemia   Relevant Orders   Lipid panel   Intractable migraine without aura and without status migrainosus    Use of fioricet prn Acute episode once a month Refill sent      Relevant Medications   butalbital-acetaminophen-caffeine (FIORICET) 50-325-40 MG tablet   FLUoxetine (PROZAC) 20 MG capsule   Lipoma of right thigh    Mid hamstring, oval, soft, mobile, non tender, no erythema, no skin retraction/dimpling. Reports intermittent pain when sitting on hard surface. She declined referral to general surgery at this time.      Major depressive disorder with single episode, in full remission (HCC)    Stable mood with fluoxetine. Refill sent      Relevant Medications   FLUoxetine (PROZAC) 20 MG capsule   Onychomycosis    Toenail Ciclopirox cream sent      Relevant Medications   ciclopirox (LOPROX) 0.77 % cream   Pulmonary nodule, right    Per CT cardiac 06/2022:Mediastinum/Nodes: No enlarged lymph nodes within the visualized Mediastinum. Lungs/Pleura: There is no pleural effusion. There is a 3 mm pulmonary nodule in the RIGHT middle lobe (series 10, image 26). Upper abdomen: No significant findings in the visualized upper abdomen. Musculoskeletal/Chest wall: No chest wall mass or suspicious osseous findings within the visualized chest.  asymptomatic No hx of tobacco use or second smoke exposure. No additional test needed at this  time      Right anterior knee pain    Chronic, worse with prolong walking or standing or inactivity or going down stairs Denies any previous injury She walks 3x/week for exercise. Come relief with ibuprofen and use of copper knee brace.  No effusion or erythema noted Crepitus noted. Entered referral to sports medicine Advised to decrease walking distance.      Relevant Orders   Ambulatory referral to Sports Medicine   Other Visit Diagnoses     Encounter for preventative adult health care exam with abnormal findings    -  Primary   Relevant Orders   Comprehensive metabolic panel   Immunization due       Relevant Orders   Flu Vaccine Trivalent High Dose (Fluad) (Completed)   Colon cancer screening       Relevant Orders   Cologuard      Return in about 1 year (around 05/26/2024) for CPE (fasting).     Alysia Penna, NP

## 2023-05-27 NOTE — Assessment & Plan Note (Signed)
Toenail Ciclopirox cream sent

## 2023-05-27 NOTE — Assessment & Plan Note (Signed)
Per CT cardiac 06/2022:Mediastinum/Nodes: No enlarged lymph nodes within the visualized Mediastinum. Lungs/Pleura: There is no pleural effusion. There is a 3 mm pulmonary nodule in the RIGHT middle lobe (series 10, image 26). Upper abdomen: No significant findings in the visualized upper abdomen. Musculoskeletal/Chest wall: No chest wall mass or suspicious osseous findings within the visualized chest.  asymptomatic No hx of tobacco use or second smoke exposure. No additional test needed at this time

## 2023-05-27 NOTE — Assessment & Plan Note (Addendum)
Use of fioricet prn Acute episode once a month Refill sent

## 2023-05-27 NOTE — Assessment & Plan Note (Signed)
Mid hamstring, oval, soft, mobile, non tender, no erythema, no skin retraction/dimpling. Reports intermittent pain when sitting on hard surface. She declined referral to general surgery at this time.

## 2023-05-27 NOTE — Patient Instructions (Signed)
Go to lab Continue Heart healthy diet and daily exercise. Maintain current medications.  Preventive Care 77 Years and Older, Female Preventive care refers to lifestyle choices and visits with your health care provider that can promote health and wellness. Preventive care visits are also called wellness exams. What can I expect for my preventive care visit? Counseling Your health care provider may ask you questions about your: Medical history, including: Past medical problems. Family medical history. Pregnancy and menstrual history. History of falls. Current health, including: Memory and ability to understand (cognition). Emotional well-being. Home life and relationship well-being. Sexual activity and sexual health. Lifestyle, including: Alcohol, nicotine or tobacco, and drug use. Access to firearms. Diet, exercise, and sleep habits. Work and work Astronomer. Sunscreen use. Safety issues such as seatbelt and bike helmet use. Physical exam Your health care provider will check your: Height and weight. These may be used to calculate your BMI (body mass index). BMI is a measurement that tells if you are at a healthy weight. Waist circumference. This measures the distance around your waistline. This measurement also tells if you are at a healthy weight and may help predict your risk of certain diseases, such as type 2 diabetes and high blood pressure. Heart rate and blood pressure. Body temperature. Skin for abnormal spots. What immunizations do I need?  Vaccines are usually given at various ages, according to a schedule. Your health care provider will recommend vaccines for you based on your age, medical history, and lifestyle or other factors, such as travel or where you work. What tests do I need? Screening Your health care provider may recommend screening tests for certain conditions. This may include: Lipid and cholesterol levels. Hepatitis C test. Hepatitis B test. HIV  (human immunodeficiency virus) test. STI (sexually transmitted infection) testing, if you are at risk. Lung cancer screening. Colorectal cancer screening. Diabetes screening. This is done by checking your blood sugar (glucose) after you have not eaten for a while (fasting). Mammogram. Talk with your health care provider about how often you should have regular mammograms. BRCA-related cancer screening. This may be done if you have a family history of breast, ovarian, tubal, or peritoneal cancers. Bone density scan. This is done to screen for osteoporosis. Talk with your health care provider about your test results, treatment options, and if necessary, the need for more tests. Follow these instructions at home: Eating and drinking  Eat a diet that includes fresh fruits and vegetables, whole grains, lean protein, and low-fat dairy products. Limit your intake of foods with high amounts of sugar, saturated fats, and salt. Take vitamin and mineral supplements as recommended by your health care provider. Do not drink alcohol if your health care provider tells you not to drink. If you drink alcohol: Limit how much you have to 0-1 drink a day. Know how much alcohol is in your drink. In the [place] one drink equals one 12 oz bottle of beer (355 mL), one 5 oz glass of wine (148 mL), or one 1 oz glass of hard liquor (44 mL). Lifestyle Brush your teeth every morning and night with fluoride toothpaste. Floss one time each day. Exercise for at least 30 minutes 5 or more days each week. Do not use any products that contain nicotine or tobacco. These products include cigarettes, chewing tobacco, and vaping devices, such as e-cigarettes. If you need help quitting, ask your health care provider. Do not use drugs. If you are sexually active, practice safe sex. Use a condom or other  form of protection in order to prevent STIs. Take aspirin only as told by your health care provider. Make sure that you understand  how much to take and what form to take. Work with your health care provider to find out whether it is safe and beneficial for you to take aspirin daily. Ask your health care provider if you need to take a cholesterol-lowering medicine (statin). Find healthy ways to manage stress, such as: Meditation, yoga, or listening to music. Journaling. Talking to a trusted person. Spending time with friends and family. Minimize exposure to UV radiation to reduce your risk of skin cancer. Safety Always wear your seat belt while driving or riding in a vehicle. Do not drive: If you have been drinking alcohol. Do not ride with someone who has been drinking. When you are tired or distracted. While texting. If you have been using any mind-altering substances or drugs. Wear a helmet and other protective equipment during sports activities. If you have firearms in your house, make sure you follow all gun safety procedures. What's next? Visit your health care provider once a year for an annual wellness visit. Ask your health care provider how often you should have your eyes and teeth checked. Stay up to date on all vaccines. This information is not intended to replace advice given to you by your health care provider. Make sure you discuss any questions you have with your health care provider. Document Revised: 12/03/2020 Document Reviewed: 12/03/2020 Elsevier Patient Education  2024 [employer].

## 2023-05-27 NOTE — Assessment & Plan Note (Signed)
Chronic, worse with prolong walking or standing or inactivity or going down stairs Denies any previous injury She walks 3x/week for exercise. Come relief with ibuprofen and use of copper knee brace.  No effusion or erythema noted Crepitus noted. Entered referral to sports medicine Advised to decrease walking distance.

## 2023-05-27 NOTE — Assessment & Plan Note (Signed)
Stable mood with fluoxetine Refill sent 

## 2023-05-30 MED ORDER — ROSUVASTATIN CALCIUM 20 MG PO TABS: 20.0000 mg | ORAL_TABLET | Freq: Every evening | ORAL | 3 refills | Status: AC

## 2023-06-28 ENCOUNTER — Ambulatory Visit: Payer: Medicare HMO | Admitting: Family Medicine

## 2023-06-29 NOTE — Progress Notes (Signed)
   I, Leotis Batter, CMA acting as a scribe for Artist Lloyd, MD.  Sarah Reid is a 68 y.o. female who presents to [facility] at [facility] today for R knee pain x 6-8 months, progressively worsening. Mechanical sx present, clicking. Pt locates pain to proximal and deep to patella. Also has pain in the right hip. Denies radicular sx. No recent imaging.   R Knee swelling: no Mechanical symptoms: clicking Aggravates: squatting, prolonged ambulation Treatments tried: IBU, copper knee brace,   Additionally she notes some pain in the right lateral hip worse with activity better with rest.  Pertinent review of systems: No fevers or chills  Relevant historical information: Migraine headache   Exam:  BP 110/78   Pulse 65   Ht 5' 3 (1.6 m)   Wt 130 lb (59 kg)   SpO2 93%   BMI 23.03 kg/m  General: Well Developed, well nourished, and in no acute distress.   MSK: Right knee normal-appearing normal motion.  Crepitation present with knee extension. Intact strength. Stable ligamentous exam.  Right hip tender palpation greater trochanter pain with abduction.    Lab and Radiology Results  X-ray images right knee obtained today personally and independently interpreted Mild degeneration.  No acute fractures. Await formal radiology review     Assessment and Plan: 68 y.o. female with right anterior knee pain predominantly due to patellofemoral chondromalacia.  She does have some weakness to the hip abductors and VMO which could be contributory.  Plan for physical therapy.  Additionally she has some pain in the right lateral hip due to hip abductor tendinopathy and weakness.  Again PT should be helpful.  Recommend Tylenol  and Voltaren gel.  We talked about safe dosing of ibuprofen.  She lives in [place] so we will use PT in [place].  Recheck in 8 weeks especially if not better.   PDMP not reviewed this encounter. Orders Placed This Encounter   Procedures   DG Knee AP/LAT W/Sunrise Right    Standing Status:   Future    Number of Occurrences:   1    Expiration Date:   07/31/2023    Reason for Exam (SYMPTOM  OR DIAGNOSIS REQUIRED):   right knee pain    Preferred imaging location?:   [place] [facility]   Ambulatory referral to Physical Therapy    Referral Priority:   Routine    Referral Type:   Physical Medicine    Referral Reason:   Specialty Services Required    Requested Specialty:   Physical Therapy    Number of Visits Requested:   1   No orders of the defined types were placed in this encounter.    Discussed warning signs or symptoms. Please see discharge instructions. Patient expresses understanding.   The above documentation has been reviewed and is accurate and complete Artist Lloyd, M.D.

## 2023-06-30 ENCOUNTER — Ambulatory Visit (INDEPENDENT_AMBULATORY_CARE_PROVIDER_SITE_OTHER): Payer: Medicare HMO

## 2023-06-30 ENCOUNTER — Encounter: Payer: Self-pay | Admitting: Family Medicine

## 2023-06-30 ENCOUNTER — Ambulatory Visit (INDEPENDENT_AMBULATORY_CARE_PROVIDER_SITE_OTHER): Payer: Medicare HMO | Admitting: Family Medicine

## 2023-06-30 ENCOUNTER — Other Ambulatory Visit: Payer: Self-pay

## 2023-06-30 VITALS — BP 110/78 | HR 65 | Ht 63.0 in | Wt 130.0 lb

## 2023-06-30 DIAGNOSIS — G8929 Other chronic pain: Secondary | ICD-10-CM

## 2023-06-30 DIAGNOSIS — M25561 Pain in right knee: Secondary | ICD-10-CM

## 2023-06-30 DIAGNOSIS — M25551 Pain in right hip: Secondary | ICD-10-CM

## 2023-06-30 NOTE — Patient Instructions (Addendum)
 Thank you for coming in today.  Please get an Xray today before you leave  I've referred you to Physical Therapy.  Let us  know if you don't hear from them in one week.   Please use Voltaren gel (Generic Diclofenac Gel) up to 4x daily for pain as needed.  This is available over-the-counter as both the name brand Voltaren gel and the generic diclofenac gel.   Tylenol  arthritis can help   OK to use ibuprofen every once in a while.   Recheck in 8 weeks.   Return sooner if you have problems.   If in 8 weeks you are better ok to cancel the return visit.

## 2023-07-11 ENCOUNTER — Encounter: Payer: Self-pay | Admitting: Family Medicine

## 2023-07-11 DIAGNOSIS — M25561 Pain in right knee: Secondary | ICD-10-CM | POA: Diagnosis not present

## 2023-07-11 DIAGNOSIS — M6281 Muscle weakness (generalized): Secondary | ICD-10-CM | POA: Diagnosis not present

## 2023-07-11 DIAGNOSIS — R2689 Other abnormalities of gait and mobility: Secondary | ICD-10-CM | POA: Diagnosis not present

## 2023-07-11 DIAGNOSIS — M25551 Pain in right hip: Secondary | ICD-10-CM | POA: Diagnosis not present

## 2023-07-11 NOTE — Progress Notes (Signed)
Right knee x-ray looks normal to radiology

## 2023-07-13 DIAGNOSIS — R2689 Other abnormalities of gait and mobility: Secondary | ICD-10-CM | POA: Diagnosis not present

## 2023-07-13 DIAGNOSIS — M25561 Pain in right knee: Secondary | ICD-10-CM | POA: Diagnosis not present

## 2023-07-13 DIAGNOSIS — M25551 Pain in right hip: Secondary | ICD-10-CM | POA: Diagnosis not present

## 2023-07-13 DIAGNOSIS — M6281 Muscle weakness (generalized): Secondary | ICD-10-CM | POA: Diagnosis not present

## 2023-07-20 DIAGNOSIS — M25561 Pain in right knee: Secondary | ICD-10-CM | POA: Diagnosis not present

## 2023-07-20 DIAGNOSIS — M25551 Pain in right hip: Secondary | ICD-10-CM | POA: Diagnosis not present

## 2023-07-20 DIAGNOSIS — R2689 Other abnormalities of gait and mobility: Secondary | ICD-10-CM | POA: Diagnosis not present

## 2023-07-20 DIAGNOSIS — M6281 Muscle weakness (generalized): Secondary | ICD-10-CM | POA: Diagnosis not present

## 2023-07-27 DIAGNOSIS — R2689 Other abnormalities of gait and mobility: Secondary | ICD-10-CM | POA: Diagnosis not present

## 2023-07-27 DIAGNOSIS — M25561 Pain in right knee: Secondary | ICD-10-CM | POA: Diagnosis not present

## 2023-07-27 DIAGNOSIS — M25551 Pain in right hip: Secondary | ICD-10-CM | POA: Diagnosis not present

## 2023-07-27 DIAGNOSIS — M6281 Muscle weakness (generalized): Secondary | ICD-10-CM | POA: Diagnosis not present

## 2023-08-10 DIAGNOSIS — M6281 Muscle weakness (generalized): Secondary | ICD-10-CM | POA: Diagnosis not present

## 2023-08-10 DIAGNOSIS — M25561 Pain in right knee: Secondary | ICD-10-CM | POA: Diagnosis not present

## 2023-08-10 DIAGNOSIS — R2689 Other abnormalities of gait and mobility: Secondary | ICD-10-CM | POA: Diagnosis not present

## 2023-08-10 DIAGNOSIS — M25551 Pain in right hip: Secondary | ICD-10-CM | POA: Diagnosis not present

## 2023-08-25 ENCOUNTER — Ambulatory Visit: Payer: Medicare HMO | Admitting: Family Medicine

## 2024-04-20 DIAGNOSIS — H524 Presbyopia: Secondary | ICD-10-CM | POA: Diagnosis not present

## 2024-04-23 DIAGNOSIS — H33191 Other retinoschisis and retinal cysts, right eye: Secondary | ICD-10-CM | POA: Diagnosis not present

## 2024-04-23 DIAGNOSIS — H43811 Vitreous degeneration, right eye: Secondary | ICD-10-CM | POA: Diagnosis not present

## 2024-04-23 DIAGNOSIS — H2513 Age-related nuclear cataract, bilateral: Secondary | ICD-10-CM | POA: Diagnosis not present

## 2024-04-23 DIAGNOSIS — H35342 Macular cyst, hole, or pseudohole, left eye: Secondary | ICD-10-CM | POA: Diagnosis not present

## 2024-04-23 DIAGNOSIS — H43822 Vitreomacular adhesion, left eye: Secondary | ICD-10-CM | POA: Diagnosis not present

## 2024-05-09 DIAGNOSIS — H35342 Macular cyst, hole, or pseudohole, left eye: Secondary | ICD-10-CM | POA: Diagnosis not present

## 2024-05-19 ENCOUNTER — Other Ambulatory Visit: Payer: Self-pay | Admitting: Nurse Practitioner

## 2024-05-19 DIAGNOSIS — F325 Major depressive disorder, single episode, in full remission: Secondary | ICD-10-CM

## 2024-05-29 DIAGNOSIS — Z23 Encounter for immunization: Secondary | ICD-10-CM | POA: Diagnosis not present

## 2024-07-11 NOTE — Progress Notes (Signed)
 Sarah Reid                                          MRN: [id number]   07/11/2024   The [facility] Quality Team Specialist reviewed this patient medical record for the purposes of chart review for care gap closure. The following were reviewed: chart review for care gap closure-colorectal cancer screening.    [facility] Quality Team

## 2024-07-25 ENCOUNTER — Other Ambulatory Visit: Payer: Self-pay | Admitting: Nurse Practitioner

## 2024-07-25 DIAGNOSIS — F325 Major depressive disorder, single episode, in full remission: Secondary | ICD-10-CM

## 2024-07-26 NOTE — Telephone Encounter (Signed)
 Please review and advise  Requesting: Fluoxetine  (Prozac ) Directions: Take 1 capsule by mouth daily Last Visit: 05/27/23 Next Visit: 10/26/2024 Last Refill: 05/22/24

## 2024-08-01 ENCOUNTER — Ambulatory Visit: Admitting: Nurse Practitioner

## 2024-10-26 ENCOUNTER — Encounter: Admitting: Nurse Practitioner
